# Patient Record
Sex: Female | Born: 1965 | Race: White | Hispanic: No | Marital: Married | Smoking: Never smoker
Health system: Southern US, Community
[De-identification: ages and names within clinical notes are randomized; demographics above are authoritative.]

## PROBLEM LIST (undated history)

## (undated) DIAGNOSIS — K219 Gastro-esophageal reflux disease without esophagitis: Secondary | ICD-10-CM

## (undated) DIAGNOSIS — I10 Essential (primary) hypertension: Secondary | ICD-10-CM

## (undated) DIAGNOSIS — Z800 Family history of malignant neoplasm of digestive organs: Secondary | ICD-10-CM

## (undated) DIAGNOSIS — C44629 Squamous cell carcinoma of skin of left upper limb, including shoulder: Secondary | ICD-10-CM

## (undated) DIAGNOSIS — N63 Unspecified lump in unspecified breast: Secondary | ICD-10-CM

## (undated) HISTORY — DX: Essential (primary) hypertension: I10

## (undated) HISTORY — PX: DILATION AND CURETTAGE OF UTERUS: SHX78

## (undated) HISTORY — DX: Family history of malignant neoplasm of digestive organs: Z80.0

## (undated) HISTORY — DX: Squamous cell carcinoma of skin of left upper limb, including shoulder: C44.629

## (undated) HISTORY — DX: Unspecified lump in unspecified breast: N63.0

---

## 1991-01-30 DIAGNOSIS — O321XX0 Maternal care for breech presentation, not applicable or unspecified: Secondary | ICD-10-CM

## 2005-07-30 ENCOUNTER — Ambulatory Visit: Payer: Self-pay | Admitting: Unknown Physician Specialty

## 2007-12-10 ENCOUNTER — Ambulatory Visit: Payer: Self-pay | Admitting: Unknown Physician Specialty

## 2008-12-14 ENCOUNTER — Ambulatory Visit: Payer: Self-pay | Admitting: Unknown Physician Specialty

## 2009-12-29 ENCOUNTER — Ambulatory Visit: Payer: Self-pay | Admitting: Unknown Physician Specialty

## 2010-01-18 ENCOUNTER — Ambulatory Visit: Payer: Self-pay | Admitting: Unknown Physician Specialty

## 2011-06-06 ENCOUNTER — Ambulatory Visit: Payer: Self-pay | Admitting: Unknown Physician Specialty

## 2011-10-23 DIAGNOSIS — N63 Unspecified lump in unspecified breast: Secondary | ICD-10-CM

## 2011-10-23 HISTORY — DX: Unspecified lump in unspecified breast: N63.0

## 2011-10-23 HISTORY — PX: BREAST BIOPSY: SHX20

## 2012-09-15 ENCOUNTER — Ambulatory Visit: Payer: Self-pay

## 2012-09-16 ENCOUNTER — Ambulatory Visit: Payer: Self-pay

## 2012-10-04 ENCOUNTER — Encounter: Payer: Self-pay | Admitting: General Surgery

## 2012-11-29 ENCOUNTER — Encounter: Payer: Self-pay | Admitting: *Deleted

## 2012-11-29 DIAGNOSIS — N63 Unspecified lump in unspecified breast: Secondary | ICD-10-CM | POA: Insufficient documentation

## 2013-01-13 ENCOUNTER — Ambulatory Visit: Payer: Self-pay | Admitting: General Surgery

## 2013-01-14 ENCOUNTER — Telehealth: Payer: Self-pay | Admitting: *Deleted

## 2013-01-14 NOTE — Telephone Encounter (Signed)
Left message for pt to call and reschedule missed appointment 01-13-13 with Dr Evette Cristal

## 2013-02-02 ENCOUNTER — Encounter: Payer: Self-pay | Admitting: General Surgery

## 2013-02-02 ENCOUNTER — Other Ambulatory Visit: Payer: Self-pay

## 2013-02-02 ENCOUNTER — Ambulatory Visit (INDEPENDENT_AMBULATORY_CARE_PROVIDER_SITE_OTHER): Payer: BC Managed Care – PPO | Admitting: General Surgery

## 2013-02-02 VITALS — BP 126/76 | Ht 63.0 in | Wt 166.0 lb

## 2013-02-02 DIAGNOSIS — D249 Benign neoplasm of unspecified breast: Secondary | ICD-10-CM | POA: Insufficient documentation

## 2013-02-02 DIAGNOSIS — N63 Unspecified lump in unspecified breast: Secondary | ICD-10-CM

## 2013-02-02 DIAGNOSIS — D241 Benign neoplasm of right breast: Secondary | ICD-10-CM

## 2013-02-02 NOTE — Patient Instructions (Addendum)
Patient to have right breast diagnostic mammogram in June 2014.

## 2013-02-02 NOTE — Progress Notes (Signed)
Patient ID: Jessica Harmon, female   DOB: 1966/05/20, 47 y.o.   MRN: [id number]  Chief Complaint  Patient presents with  . Follow-up    3 month breast ultasound    HPI Jessica Harmon is a 47 y.o. female who presents for a 3 month follow up breast ultrasound. The patient was last seen in December 2013 were she underwent a right breast biopsy that was benign as well as two cysts that were aspirated in the right breast. Path report showed a tubular adenoma. The patient states no problems with her breast at this time.  HPI  Past Medical History  Diagnosis Date  . Hypertension   . Lump or mass in breast 2013    Past Surgical History  Procedure Laterality Date  . Cesarean section  1992,2000    Family History  Problem Relation Age of Onset  . Breast cancer Paternal Aunt 7    Social History History  Substance Use Topics  . Smoking status: Never Smoker   . Smokeless tobacco: Not on file  . Alcohol Use: No    No Known Allergies  Current Outpatient Prescriptions  Medication Sig Dispense Refill  . ramipril (ALTACE) 5 MG tablet Take 5 mg by mouth daily.       No current facility-administered medications for this visit.    Review of Systems Review of Systems  Constitutional: Negative.   Cardiovascular: Negative.     Blood pressure 126/76, height 5\' 3"  (1.6 m), weight 166 lb (75.297 kg), last menstrual period 10/04/2012.  Physical Exam Physical Exam  Constitutional: She appears well-developed and well-nourished.  Eyes: Conjunctivae are normal. No scleral icterus.  Neck: Trachea normal. No mass and no thyromegaly present.  Pulmonary/Chest: Right breast exhibits no inverted nipple, no mass, no nipple discharge, no skin change and no tenderness.  Lymphadenopathy:    She has no cervical adenopathy.    She has no axillary adenopathy.    Data Reviewed Ultrasound done here shows stable nodule in right breast.  Assessment    Benign neoplasm right breast     Plan   F/u right mammogram 2 mos        SANKAR,SEEPLAPUTHUR G 02/03/2013, 11:39 AM

## 2013-02-03 ENCOUNTER — Encounter: Payer: Self-pay | Admitting: General Surgery

## 2013-03-31 ENCOUNTER — Ambulatory Visit: Payer: Self-pay | Admitting: General Surgery

## 2013-04-08 ENCOUNTER — Encounter: Payer: Self-pay | Admitting: General Surgery

## 2013-04-09 ENCOUNTER — Ambulatory Visit: Payer: Self-pay | Admitting: General Surgery

## 2013-04-13 ENCOUNTER — Encounter: Payer: Self-pay | Admitting: General Surgery

## 2013-04-13 ENCOUNTER — Ambulatory Visit (INDEPENDENT_AMBULATORY_CARE_PROVIDER_SITE_OTHER): Payer: BC Managed Care – PPO | Admitting: General Surgery

## 2013-04-13 VITALS — BP 120/82 | HR 80 | Resp 14 | Ht 63.0 in | Wt 167.0 lb

## 2013-04-13 DIAGNOSIS — D249 Benign neoplasm of unspecified breast: Secondary | ICD-10-CM

## 2013-04-13 DIAGNOSIS — D241 Benign neoplasm of right breast: Secondary | ICD-10-CM

## 2013-04-13 NOTE — Progress Notes (Signed)
Patient ID: Jessica Harmon, female   DOB: [birth year], 47 y.o.   MRN: [id number]  Chief Complaint  Patient presents with  . Follow-up    mammogram    HPI Jessica Harmon is a 47 y.o. female who presents for a breast evaluation. The most recent mammogram was done on 03/31/13 with birad category 2. Patient does perform self breast checks every other month and gets regular mammograms done. The patient has a paternal aunt who was diagnosed in her fifties with breast cancer. No new problems with her breasts at this time. She had core biopsy of small mass right breast December 2013 -was a benign tubular adenoma.   HPI  Past Medical History  Diagnosis Date  . Hypertension   . Lump or mass in breast 2013    Past Surgical History  Procedure Laterality Date  . Cesarean section  1992,2000    Family History  Problem Relation Age of Onset  . Breast cancer Paternal Aunt 36    Social History History  Substance Use Topics  . Smoking status: Never Smoker   . Smokeless tobacco: Not on file  . Alcohol Use: No    No Known Allergies  Current Outpatient Prescriptions  Medication Sig Dispense Refill  . ramipril (ALTACE) 5 MG tablet Take 5 mg by mouth daily.       No current facility-administered medications for this visit.    Review of Systems Review of Systems  Constitutional: Negative.   Respiratory: Negative.   Cardiovascular: Negative.     Blood pressure 120/82, pulse 80, resp. rate 14, height 5\' 3"  (1.6 m), weight 167 lb (75.751 kg), last menstrual period 10/13/2012.  Physical Exam Physical Exam  Constitutional: She is oriented to person, place, and time. She appears well-developed and well-nourished.  Eyes: Conjunctivae are normal. No scleral icterus.  Neck: Trachea normal. No mass and no thyromegaly present.  Pulmonary/Chest: Right breast exhibits no inverted nipple, no mass, no nipple discharge, no skin change and no tenderness. Left breast exhibits no inverted nipple, no  mass, no nipple discharge, no skin change and no tenderness. Breasts are symmetrical.  Lymphadenopathy:    She has no cervical adenopathy.    She has no axillary adenopathy.  Neurological: She is alert and oriented to person, place, and time.  Skin: Skin is warm.    Data Reviewed Prior notes and path report and her right mammogram  Assessment    Stable exam, benign neoplasm right breast     Plan    RTC in 5 mos with bil screening mammogram        Maleiya Pergola G 04/13/2013, 7:25 PM

## 2013-04-13 NOTE — Patient Instructions (Addendum)
To return in 5 months with bilateral screening mammogram.

## 2013-09-16 ENCOUNTER — Ambulatory Visit: Payer: Self-pay | Admitting: General Surgery

## 2013-09-21 ENCOUNTER — Encounter: Payer: Self-pay | Admitting: General Surgery

## 2013-09-29 ENCOUNTER — Ambulatory Visit: Payer: BC Managed Care – PPO | Admitting: General Surgery

## 2014-08-23 ENCOUNTER — Encounter: Payer: Self-pay | Admitting: General Surgery

## 2014-11-18 ENCOUNTER — Ambulatory Visit: Payer: Self-pay | Admitting: Certified Nurse Midwife

## 2015-10-23 DIAGNOSIS — C44629 Squamous cell carcinoma of skin of left upper limb, including shoulder: Secondary | ICD-10-CM

## 2015-10-23 HISTORY — DX: Squamous cell carcinoma of skin of left upper limb, including shoulder: C44.629

## 2015-10-23 HISTORY — PX: OTHER SURGICAL HISTORY: SHX169

## 2015-12-06 ENCOUNTER — Other Ambulatory Visit: Payer: Self-pay | Admitting: Certified Nurse Midwife

## 2015-12-06 DIAGNOSIS — Z1231 Encounter for screening mammogram for malignant neoplasm of breast: Secondary | ICD-10-CM

## 2015-12-15 ENCOUNTER — Ambulatory Visit
Admission: RE | Admit: 2015-12-15 | Discharge: 2015-12-15 | Disposition: A | Discharge status: Home / Self Care | Payer: BLUE CROSS/BLUE SHIELD | Source: Ambulatory Visit | Attending: Certified Nurse Midwife | Admitting: Certified Nurse Midwife

## 2015-12-15 DIAGNOSIS — Z1231 Encounter for screening mammogram for malignant neoplasm of breast: Secondary | ICD-10-CM

## 2016-12-03 DIAGNOSIS — J00 Acute nasopharyngitis [common cold]: Secondary | ICD-10-CM | POA: Diagnosis not present

## 2016-12-03 DIAGNOSIS — R05 Cough: Secondary | ICD-10-CM | POA: Diagnosis not present

## 2016-12-29 DIAGNOSIS — H00012 Hordeolum externum right lower eyelid: Secondary | ICD-10-CM | POA: Diagnosis not present

## 2017-06-12 ENCOUNTER — Ambulatory Visit (INDEPENDENT_AMBULATORY_CARE_PROVIDER_SITE_OTHER): Payer: 59 | Admitting: Certified Nurse Midwife

## 2017-06-12 ENCOUNTER — Encounter: Payer: Self-pay | Admitting: Certified Nurse Midwife

## 2017-06-12 ENCOUNTER — Other Ambulatory Visit: Payer: Self-pay | Admitting: Certified Nurse Midwife

## 2017-06-12 VITALS — BP 126/76 | HR 74 | Ht 63.0 in | Wt 178.0 lb

## 2017-06-12 DIAGNOSIS — Z124 Encounter for screening for malignant neoplasm of cervix: Secondary | ICD-10-CM | POA: Diagnosis not present

## 2017-06-12 DIAGNOSIS — R072 Precordial pain: Secondary | ICD-10-CM

## 2017-06-12 DIAGNOSIS — Z1239 Encounter for other screening for malignant neoplasm of breast: Secondary | ICD-10-CM

## 2017-06-12 DIAGNOSIS — Z01419 Encounter for gynecological examination (general) (routine) without abnormal findings: Secondary | ICD-10-CM | POA: Diagnosis not present

## 2017-06-12 DIAGNOSIS — Z1211 Encounter for screening for malignant neoplasm of colon: Secondary | ICD-10-CM

## 2017-06-12 DIAGNOSIS — Z1231 Encounter for screening mammogram for malignant neoplasm of breast: Secondary | ICD-10-CM | POA: Diagnosis not present

## 2017-06-12 NOTE — Progress Notes (Addendum)
Gynecology Annual Exam  PCP: Marinda Elk, MD  Chief Complaint:  Chief Complaint  Patient presents with  . Gynecologic Exam    History of Present Illness:Jessica Harmon is a 51 year old Caucasian/White female, G4 P2022, who presents for her annual exam. She is having no significant GYN problems. Does complain of recent problems (3-6 months) with "feeling like some foods are sticking" and won't go into stomach. Sometimes has to induce vomiting to dislodge food. Has a history of heartburn for years for which she takes Tums, once or twice a day.Has not had a EGD in the past. Has never had a colonoscopy. Her menses are absent since 09/2012 and she is postmenopausal. She does not have difficulty sleeping, hot flashes, and vaginal dryness.  She has had no spotting.   The patient's past medical history is notable for a history of hypertension. She had a right breast bx in 12/13 which was benign..  Since her last annual GYN exam dated 11/09/2015, she has had a skin biopsy of a lesion on her left forearm which turned out to be squamous cell carcinoma.  She is sexually active.  Her most recent pap smear was obtained 11/09/2015 and was negative  Her most recent mammogram obtained on 12/15/2015 which was negative.  There is a positive history of breast cancer in her paternal aunt. Genetic testing has not been done.  There is no family history of ovarian cancer.  The patient does do occ monthly self breast exams.  The patient does not smoke.  The patient does not drink alcohol.  The patient does not use illegal drugs.  The patient exercises by walking 2 miles/day The patient does not get adequate calcium in her diet.  She had a recent cholesterol screen in 2017 by PCP Boykin Reaper at [facility] that was normal.    The patient denies current symptoms of depression.    Review of Systems: Review of Systems  Constitutional: Negative for chills, fever and weight loss.  HENT: Negative for  congestion, sinus pain and sore throat.   Eyes: Negative for blurred vision and pain.  Respiratory: Negative for hemoptysis, shortness of breath and wheezing.   Cardiovascular: Negative for chest pain, palpitations and leg swelling.  Gastrointestinal: Negative for abdominal pain, blood in stool, diarrhea, heartburn, nausea and vomiting.  Genitourinary: Negative for dysuria, frequency, hematuria and urgency.  Musculoskeletal: Negative for back pain, joint pain and myalgias.  Skin: Negative for itching and rash.  Neurological: Negative for dizziness, tingling and headaches.  Endo/Heme/Allergies: Negative for environmental allergies and polydipsia. Does not bruise/bleed easily.       Negative for hirsutism   Psychiatric/Behavioral: Negative for depression. The patient is not nervous/anxious and does not have insomnia.     Past Medical History:  Past Medical History:  Diagnosis Date  . Hypertension   . Lump or mass in breast 2013  . Squamous cell cancer of skin of forearm, left 2017    Past Surgical History:  Past Surgical History:  Procedure Laterality Date  . BREAST BIOPSY Right 2013   needle  . CESAREAN SECTION  1992,2000  . DILATION AND CURETTAGE OF UTERUS    . excision of squamous cell cancer of left forearm  2017    Family History:  Family History  Problem Relation Age of Onset  . Breast cancer Paternal Aunt 58  . Hyperlipidemia Mother   . Hypertension Mother   . Neuropathy Mother  in feet  . Hypertension Father   . Hypertension Sister   . Hypertension Brother   . Hypertension Brother   . Pancreatic cancer Maternal Aunt 60    Social History:  Social History   Social History  . Marital status: Married    Spouse name: N/A  . Number of children: 2  . Years of education: N/A   Occupational History  . Business assistant    Social History Main Topics  . Smoking status: Never Smoker  . Smokeless tobacco: Never Used  . Alcohol use No  . Drug use: No  .  Sexual activity: Yes    Partners: Male    Birth control/ protection: Post-menopausal   Other Topics Concern  . Not on file   Social History Narrative   Moved to near [place] course in 2017/18.  [name] graduated from [employer] and now lives in [place]. Son Hold graduated from high school in 2018 and will be attending [employer].    Allergies:  No Known Allergies  Medications: Tums Ultra Physical Exam Vitals:BP 126/76   Pulse 74   Ht 5\' 3"  (1.6 m)   Wt 178 lb (80.7 kg)   LMP 10/13/2012   BMI 31.53 kg/m   General: Pleasant WF in  NAD HEENT: normocephalic, anicteric Neck: no thyroid enlargement, no palpable nodules, no cervical lymphadenopathy  Pulmonary: No increased work of breathing, CTAB Cardiovascular: RRR, without murmur  Breast: Breast symmetrical, no tenderness, no palpable nodules or masses, no skin or nipple retraction present, no nipple discharge.  No axillary, infraclavicular or supraclavicular lymphadenopathy. Abdomen: Soft, non-tender, non-distended.  Umbilicus without lesions.  No hepatomegaly or masses palpable. No evidence of hernia. Genitourinary:  External: Normal external female genitalia.  Normal urethral meatus, normal Bartholin's and Skene's glands.    Vagina: decreased rugae, no evidence of prolapse, only able to do single digit pelvic exam due to contracted vaginal opening    Cervix: Grossly normal in appearance, no bleeding, non-tender  Uterus: Anteverted, normal size, shape, and consistency, mobile, and non-tender  Adnexa: No adnexal masses, non-tender  Rectal: deferred  Lymphatic: no evidence of inguinal lymphadenopathy Extremities: no edema, erythema, or tenderness Neurologic: Grossly intact Psychiatric: mood appropriate, affect full     Assessment: 51 y.o. [id number] annual gyn exam GI problems/ Age related risk of colon cancer Plan:   1) Breast cancer screening - recommend monthly self breast exam and annual screening mammograms. Mammogram  was ordered today.  Patient to schedule at [facility]  2) Referral to GI for evaluation of substernal discomfort when eating and for colonoscopy  3) Cervical cancer screening - Pap was done.   4) Osteoporosis prevention: discussed calcium and vitamin D3 requirements.   5) Routine healthcare maintenance including cholesterol and diabetes screening managed by PCP   Dalia Heading, CNM

## 2017-06-12 NOTE — Patient Instructions (Signed)
Preventing Osteoporosis, Adult Osteoporosis is a condition that causes the bones to get weaker. With osteoporosis, the bones become thinner, and the normal spaces in bone tissue become larger. This can make the bones weak and cause them to break more easily. People who have osteoporosis are more likely to break their wrist, spine, or hip. Even a minor accident or injury can be enough to break weak bones. Osteoporosis can occur with aging. Your body constantly replaces old bone tissue with new tissue. As you get older, you may lose bone tissue more quickly, or it may be replaced more slowly. Osteoporosis is more likely to develop if you have poor nutrition or do not get enough calcium or vitamin D. Other lifestyle factors can also play a role. By making some diet and lifestyle changes, you can help to keep your bones healthy and help to prevent osteoporosis. What nutrition changes can be made? Nutrition plays an important role in maintaining healthy, strong bones.  Make sure you get enough calcium every day from food or from calcium supplements. ? If you are age 50 or younger, aim to get 1,000 mg of calcium every day. ? If you are older than age 50, aim to get 1,200 mg of calcium every day.  Try to get enough vitamin D every day. ? If you are age 70 or younger, aim to get 600 international units (IU) every day. ? If you are older than age 70, aim to get 800 international units every day.  Follow a healthy diet. Eat plenty of foods that contain calcium and vitamin D. ? Calcium is in milk, cheese, yogurt, and other dairy products. Some fish and vegetables are also good sources of calcium. Many foods such as cereals and breads have had calcium added to them (are fortified). Check nutrition labels to see how much calcium is in a food or drink. ? Foods that contain vitamin D include milk, cereals, salmon, and tuna. Your body also makes vitamin D when you are out in the sun. Bare skin exposure to the sun on  your face, arms, legs, or back for no more than 30 minutes a day, 2 times per week is more than enough. Beyond that, it is important to use sunblock to protect your skin from sunburn, which increases your risk for skin cancer.  What lifestyle changes can be made? Making changes in your everyday life can also play an important role in preventing osteoporosis.  Stay active and get exercise every day. Ask your health care provider what types of exercise are best for you.  Do not use any products that contain nicotine or tobacco, such as cigarettes and e-cigarettes. If you need help quitting, ask your health care provider.  Limit alcohol intake to no more than 1 drink a day for nonpregnant women and 2 drinks a day for men. One drink equals 12 oz of beer, 5 oz of wine, or 1 oz of hard liquor.  Why are these changes important? Making these nutrition and lifestyle changes can:  Help you develop and maintain healthy, strong bones.  Prevent loss of bone mass and the problems that are caused by that loss, such as broken bones and delayed healing.  Make you feel better mentally and physically.  What can happen if changes are not made? Problems that can result from osteoporosis can be very serious. These may include:  A higher risk of broken bones that are painful and do not heal well.  Physical malformations, such as   a collapsed spine or a hunched back.  Problems with movement.  Where to find support: If you need help making changes to prevent osteoporosis, talk with your health care provider. You can ask for a referral to a diet and nutrition specialist (dietitian) and a physical therapist. Where to find more information: Learn more about osteoporosis from:  NIH Osteoporosis and Related Bone Diseases National Resource Center: www.niams.nih.gov/health_info/bone/osteoporosis/osteoporosis_ff.asp  U.S. Office on Women's Health:  www.womenshealth.gov/publications/our-publications/fact-sheet/osteoporosis.html  National Osteoporosis Foundation: www.nof.org/patients/what-is-osteoporosis/  Summary  Osteoporosis is a condition that causes weak bones that are more likely to break.  Eating a healthy diet and making sure you get enough calcium and vitamin D can help prevent osteoporosis.  Other ways to reduce your risk of osteoporosis include getting regular exercise and avoiding alcohol and products that contain nicotine or tobacco. This information is not intended to replace advice given to you by your health care provider. Make sure you discuss any questions you have with your health care provider. Document Released: 10/23/2015 Document Revised: 06/18/2016 Document Reviewed: 06/18/2016 Elsevier Interactive Patient Education  2018 Elsevier Inc.  

## 2017-06-15 LAB — IGP,RFX APTIMA HPV ALL PTH: PAP SMEAR COMMENT: 0

## 2017-07-03 ENCOUNTER — Ambulatory Visit
Admission: RE | Admit: 2017-07-03 | Discharge: 2017-07-03 | Disposition: A | Discharge status: Home / Self Care | Payer: 59 | Source: Ambulatory Visit | Attending: Certified Nurse Midwife | Admitting: Certified Nurse Midwife

## 2017-07-03 DIAGNOSIS — Z1231 Encounter for screening mammogram for malignant neoplasm of breast: Secondary | ICD-10-CM | POA: Insufficient documentation

## 2017-07-03 DIAGNOSIS — Z1239 Encounter for other screening for malignant neoplasm of breast: Secondary | ICD-10-CM

## 2017-07-04 ENCOUNTER — Other Ambulatory Visit: Payer: Self-pay | Admitting: Certified Nurse Midwife

## 2017-07-04 DIAGNOSIS — N6489 Other specified disorders of breast: Secondary | ICD-10-CM

## 2017-07-04 DIAGNOSIS — N6002 Solitary cyst of left breast: Secondary | ICD-10-CM | POA: Diagnosis not present

## 2017-07-04 DIAGNOSIS — N6320 Unspecified lump in the left breast, unspecified quadrant: Secondary | ICD-10-CM

## 2017-07-04 DIAGNOSIS — R928 Other abnormal and inconclusive findings on diagnostic imaging of breast: Secondary | ICD-10-CM

## 2017-07-18 ENCOUNTER — Ambulatory Visit
Admission: RE | Admit: 2017-07-18 | Discharge: 2017-07-18 | Disposition: A | Discharge status: Home / Self Care | Payer: 59 | Source: Ambulatory Visit | Attending: Certified Nurse Midwife | Admitting: Certified Nurse Midwife

## 2017-07-18 DIAGNOSIS — N6489 Other specified disorders of breast: Secondary | ICD-10-CM

## 2017-07-18 DIAGNOSIS — N6320 Unspecified lump in the left breast, unspecified quadrant: Secondary | ICD-10-CM

## 2017-07-18 DIAGNOSIS — N6001 Solitary cyst of right breast: Secondary | ICD-10-CM | POA: Diagnosis not present

## 2017-07-18 DIAGNOSIS — R928 Other abnormal and inconclusive findings on diagnostic imaging of breast: Secondary | ICD-10-CM

## 2017-07-18 DIAGNOSIS — N6002 Solitary cyst of left breast: Secondary | ICD-10-CM | POA: Diagnosis not present

## 2017-08-13 DIAGNOSIS — N390 Urinary tract infection, site not specified: Secondary | ICD-10-CM | POA: Diagnosis not present

## 2017-11-24 DIAGNOSIS — I10 Essential (primary) hypertension: Secondary | ICD-10-CM | POA: Insufficient documentation

## 2017-11-24 DIAGNOSIS — Z85828 Personal history of other malignant neoplasm of skin: Secondary | ICD-10-CM | POA: Diagnosis not present

## 2017-11-24 DIAGNOSIS — J019 Acute sinusitis, unspecified: Secondary | ICD-10-CM | POA: Insufficient documentation

## 2017-11-24 DIAGNOSIS — R42 Dizziness and giddiness: Secondary | ICD-10-CM | POA: Diagnosis present

## 2017-11-24 DIAGNOSIS — R05 Cough: Secondary | ICD-10-CM | POA: Diagnosis not present

## 2017-11-24 DIAGNOSIS — R55 Syncope and collapse: Secondary | ICD-10-CM | POA: Diagnosis not present

## 2017-11-25 ENCOUNTER — Encounter: Payer: Self-pay | Admitting: Emergency Medicine

## 2017-11-25 ENCOUNTER — Emergency Department: Payer: Commercial Managed Care - HMO

## 2017-11-25 ENCOUNTER — Other Ambulatory Visit: Payer: Self-pay

## 2017-11-25 ENCOUNTER — Emergency Department
Admission: EM | Admit: 2017-11-25 | Discharge: 2017-11-25 | Disposition: A | Discharge status: Home / Self Care | Payer: Commercial Managed Care - HMO | Attending: Emergency Medicine | Admitting: Emergency Medicine

## 2017-11-25 DIAGNOSIS — J019 Acute sinusitis, unspecified: Secondary | ICD-10-CM

## 2017-11-25 DIAGNOSIS — R55 Syncope and collapse: Secondary | ICD-10-CM

## 2017-11-25 DIAGNOSIS — R05 Cough: Secondary | ICD-10-CM | POA: Diagnosis not present

## 2017-11-25 LAB — BASIC METABOLIC PANEL
Anion gap: 13 (ref 5–15)
BUN: 17 mg/dL (ref 6–20)
CHLORIDE: 110 mmol/L (ref 101–111)
CO2: 26 mmol/L (ref 22–32)
Calcium: 9.8 mg/dL (ref 8.9–10.3)
Creatinine, Ser: 0.85 mg/dL (ref 0.44–1.00)
GFR calc Af Amer: >60 mL/min (ref >60)
GFR calc non Af Amer: >60 mL/min (ref >60)
GLUCOSE: 105 mg/dL — AB (ref 65–99)
POTASSIUM: 3.6 mmol/L (ref 3.5–5.1)
Sodium: 149 mmol/L — ABNORMAL HIGH (ref 135–145)

## 2017-11-25 LAB — CBC
HEMATOCRIT: 36.2 % (ref 35.0–47.0)
Hemoglobin: 11.3 g/dL — ABNORMAL LOW (ref 12.0–16.0)
MCH: 25.2 pg — AB (ref 26.0–34.0)
MCHC: 31.3 g/dL — AB (ref 32.0–36.0)
MCV: 80.5 fL (ref 80.0–100.0)
Platelets: 331 10*3/uL (ref 150–440)
RBC: 4.5 MIL/uL (ref 3.80–5.20)
RDW: 13.4 % (ref 11.5–14.5)
WBC: 9.6 10*3/uL (ref 3.6–11.0)

## 2017-11-25 LAB — TROPONIN I
Troponin I: <0.03 ng/mL (ref <0.03)
Troponin I: <0.03 ng/mL (ref <0.03)

## 2017-11-25 MED ORDER — AMOXICILLIN-POT CLAVULANATE 875-125 MG PO TABS
1.0000 | ORAL_TABLET | Freq: Once | ORAL | Status: AC
  Administered 2017-11-25: 1 via ORAL
  Filled 2017-11-25: qty 1

## 2017-11-25 MED ORDER — AMOXICILLIN-POT CLAVULANATE 875-125 MG PO TABS: 1.0000 | ORAL_TABLET | Freq: Two times a day (BID) | ORAL | 0 refills | Status: AC

## 2017-11-25 NOTE — ED Provider Notes (Signed)
[facility] Emergency Department Provider Note   ____________________________________________   First MD Initiated Contact with Patient 11/25/17 0515     (approximate)  I have reviewed the triage vital signs and the nursing notes.   HISTORY  Chief Complaint Cough; Nasal Congestion; and Arm Pain    HPI Jessica Harmon is a 52 y.o. female who comes into the hospital today with some lightheadedness and presyncopal symptoms.  The patient reports that she has had an upper respiratory infection for the past week and she did not feel well.  She reports that she was laying in bed and thought she was going to pass out.  She reports that it was just weird feeling.  She had some chills and she was shaky.  The patient has had some cough and runny nose but denies any shortness of breath or fevers.  She has been using Mucinex and Alka-Seltzer at home.  She states that it is difficult to describe the symptoms she had.  She states that she felt a little lightheaded.  She has not been eating much this week but reports that she has been drinking ginger ale.  The patient had some nasal stuffiness as well as some sinus pressure with right ear pain but otherwise no other complaints.  She has had no shortness of breath no chest pain no nausea no vomiting.  Patient is here today for evaluation.   Past Medical History:  Diagnosis Date  . Hypertension   . Lump or mass in breast 2013  . Squamous cell cancer of skin of forearm, left 2017    Patient Active Problem List   Diagnosis Date Noted  . Benign neoplasm of breast 02/02/2013  . Lump or mass in breast     Past Surgical History:  Procedure Laterality Date  . BREAST BIOPSY Right 2013   needle  . CESAREAN SECTION  1992,2000  . DILATION AND CURETTAGE OF UTERUS    . excision of squamous cell cancer of left forearm  2017    Prior to Admission medications   Medication Sig Start Date End Date Taking? Authorizing Provider    amoxicillin-clavulanate (AUGMENTIN) 875-125 MG tablet Take 1 tablet by mouth 2 (two) times daily for 10 days. 11/25/17 12/05/17  Loney Hering, MD    Allergies Patient has no known allergies.  Family History  Problem Relation Age of Onset  . Breast cancer Paternal Aunt 68  . Hyperlipidemia Mother   . Hypertension Mother   . Neuropathy Mother        in feet  . Hypertension Father   . Hypertension Sister   . Hypertension Brother   . Hypertension Brother   . Pancreatic cancer Maternal Aunt 60    Social History Social History   Tobacco Use  . Smoking status: Never Smoker  . Smokeless tobacco: Never Used  Substance Use Topics  . Alcohol use: No  . Drug use: No    Review of Systems  Constitutional: No fever/chills Eyes: No visual changes. ENT: Nasal congestion, tenderness to palpation of maxillary, ethmoid and sphenoid sinuses Cardiovascular: Denies chest pain. Respiratory: cough Gastrointestinal: No abdominal pain.  No nausea, no vomiting.  No diarrhea.  No constipation. Genitourinary: Negative for dysuria. Musculoskeletal: Negative for back pain. Skin: Negative for rash. Neurological: Presyncope, lightheadedness   ____________________________________________   PHYSICAL EXAM:  VITAL SIGNS: ED Triage Vitals  Enc Vitals Group     BP 11/25/17 0018 (!) 167/93     Pulse Rate  11/25/17 0018 77     Resp 11/25/17 0018 16     Temp 11/25/17 0018 97.9 F (36.6 C)     Temp Source 11/25/17 0018 Oral     SpO2 11/25/17 0018 99 %     Weight 11/25/17 0018 165 lb (74.8 kg)     Height 11/25/17 0018 5\' 3"  (1.6 m)     Head Circumference --      Peak Flow --      Pain Score 11/25/17 0503 5     Pain Loc --      Pain Edu? --      Excl. in [place]? --     Constitutional: Alert and oriented. Well appearing and in mild distress. Eyes: Conjunctivae are normal. PERRL. EOMI. Head: Atraumatic. Nose: No congestion/rhinnorhea. Mouth/Throat: Mucous membranes are moist.  Oropharynx  non-erythematous. Cardiovascular: Normal rate, regular rhythm. Grossly normal heart sounds.  Good peripheral circulation. Respiratory: Normal respiratory effort.  No retractions. Lungs CTAB. Gastrointestinal: Soft and nontender. No distention. Positive bowel sounds Musculoskeletal: No lower extremity tenderness nor edema.   Neurologic:  Normal speech and language.  Cranial nerves II through XII are grossly intact with no focal motor neuro deficit Skin:  Skin is warm, dry and intact.  Psychiatric: Mood and affect are normal.   ____________________________________________   LABS (all labs ordered are listed, but only abnormal results are displayed)  Labs Reviewed  BASIC METABOLIC PANEL - Abnormal; Notable for the following components:      Result Value   Sodium 149 (*)    Glucose, Bld 105 (*)    All other components within normal limits  CBC - Abnormal; Notable for the following components:   Hemoglobin 11.3 (*)    MCH 25.2 (*)    MCHC 31.3 (*)    All other components within normal limits  TROPONIN I  TROPONIN I   ____________________________________________  EKG  ED ECG REPORT I, Loney Hering, the attending physician, personally viewed and interpreted this ECG.   Date: 11/25/2017  EKG Time: 0034  Rate: 78  Rhythm: normal sinus rhythm  Axis: normal  Intervals:none  ST&T Change: none  ____________________________________________  RADIOLOGY  ED MD interpretation:    CXR: No acute disease  Official radiology report(s): Dg Chest 2 View  Result Date: 11/25/2017 CLINICAL DATA:  Cough with near syncope EXAM: CHEST  2 VIEW COMPARISON:  None. FINDINGS: The heart size and mediastinal contours are within normal limits. Both lungs are clear. The visualized skeletal structures are unremarkable. Mild bronchitic changes. IMPRESSION: No active cardiopulmonary disease.  Mild bronchitic changes. Electronically Signed   By: Donavan Foil M.D.   On: 11/25/2017 02:01     ____________________________________________   PROCEDURES  Procedure(s) performed: None  Procedures  Critical Care performed: No  ____________________________________________   INITIAL IMPRESSION / ASSESSMENT AND PLAN / ED COURSE  As part of my medical decision making, I reviewed the following data within the electronic MEDICAL RECORD NUMBER Notes from prior ED visits and [place] Controlled Substance Database   This is a 52 year old female who comes into the hospital today with some lightheadedness and presyncope at home.  The patient is also been having some signs of sinusitis.  My differential diagnosis includes sinusitis, MI, syncope   We did check some blood work on the patient and it was unremarkable.  Her CBC is negative and she has some mild dehydration with a sodium of 149.  The patient also had a chest x-ray in 2 troponins  which were also negative. The patient likely has sinusitis which I will treat with augmentin. The patient will be discharged to home to follow up with her primary care physician.      ____________________________________________   FINAL CLINICAL IMPRESSION(S) / ED DIAGNOSES  Final diagnoses:  Acute non-recurrent sinusitis, unspecified location  Pre-syncope     ED Discharge Orders        Ordered    amoxicillin-clavulanate (AUGMENTIN) 875-125 MG tablet  2 times daily     11/25/17 0647       Note:  This document was prepared using Dragon voice recognition software and may include unintentional dictation errors.    Loney Hering, MD 11/25/17 [id number]

## 2017-11-25 NOTE — ED Notes (Signed)
Pt sitting in lobby with spouse in no acute distress.

## 2017-11-25 NOTE — ED Triage Notes (Signed)
Pt reports 5 days of sinus congestion and cough; pt says when she went to bed she felt "weird"; says she got weird feeling in her left arm and then "may have panicked"; reports feeling like she was going to pass out but did not; pt hoarse but talking in complete coherent sentences; pt says her throat feels "raw" from coughing

## 2017-11-25 NOTE — ED Notes (Signed)
Pt updated on delay. Pt verbalizes understanding.  

## 2017-11-25 NOTE — ED Notes (Signed)
Pt ambulatory with steady gait to treatment room; Ena Dawley, RN in to see pt

## 2017-11-25 NOTE — ED Triage Notes (Signed)
First rn note: pt states she has had uri symptoms "for awhile". Pt is hoarse. Pt states today has felt intermittently "shaky". Pt ambulatory without difficulty.

## 2017-11-25 NOTE — ED Notes (Signed)
Pt reports "stomach bug" and "cold" s/sx for the last 2 weeks, congestion with cough greenish production, HA, Nauseous, chills, pt reports feeling of wanting to pass out earlier but can't elaborate (denies ever passing out before), right ear pressure - appears unremarkable

## 2017-11-25 NOTE — Discharge Instructions (Signed)
Please follow up with your primary care physician for further evaluation of your symptoms.  °

## 2017-11-26 DIAGNOSIS — R42 Dizziness and giddiness: Secondary | ICD-10-CM | POA: Diagnosis not present

## 2017-11-26 DIAGNOSIS — J329 Chronic sinusitis, unspecified: Secondary | ICD-10-CM | POA: Diagnosis not present

## 2017-11-26 DIAGNOSIS — I10 Essential (primary) hypertension: Secondary | ICD-10-CM | POA: Diagnosis not present

## 2017-11-26 DIAGNOSIS — E870 Hyperosmolality and hypernatremia: Secondary | ICD-10-CM | POA: Diagnosis not present

## 2017-11-28 ENCOUNTER — Other Ambulatory Visit: Payer: Self-pay | Admitting: Physician Assistant

## 2017-11-28 DIAGNOSIS — R1310 Dysphagia, unspecified: Secondary | ICD-10-CM

## 2017-11-28 DIAGNOSIS — R1084 Generalized abdominal pain: Secondary | ICD-10-CM

## 2017-11-28 DIAGNOSIS — R110 Nausea: Secondary | ICD-10-CM

## 2017-11-28 DIAGNOSIS — I10 Essential (primary) hypertension: Secondary | ICD-10-CM | POA: Diagnosis not present

## 2017-11-29 ENCOUNTER — Ambulatory Visit
Admission: RE | Admit: 2017-11-29 | Discharge: 2017-11-29 | Disposition: A | Discharge status: Home / Self Care | Payer: 59 | Source: Ambulatory Visit | Attending: Physician Assistant | Admitting: Physician Assistant

## 2017-11-29 DIAGNOSIS — R1084 Generalized abdominal pain: Secondary | ICD-10-CM

## 2017-11-29 DIAGNOSIS — K8020 Calculus of gallbladder without cholecystitis without obstruction: Secondary | ICD-10-CM | POA: Diagnosis not present

## 2017-11-29 DIAGNOSIS — R1310 Dysphagia, unspecified: Secondary | ICD-10-CM

## 2017-11-29 DIAGNOSIS — R110 Nausea: Secondary | ICD-10-CM

## 2017-11-29 DIAGNOSIS — N281 Cyst of kidney, acquired: Secondary | ICD-10-CM | POA: Insufficient documentation

## 2017-11-29 DIAGNOSIS — K7689 Other specified diseases of liver: Secondary | ICD-10-CM | POA: Diagnosis not present

## 2017-12-03 ENCOUNTER — Ambulatory Visit: Payer: Self-pay | Admitting: General Surgery

## 2017-12-03 DIAGNOSIS — K8020 Calculus of gallbladder without cholecystitis without obstruction: Secondary | ICD-10-CM | POA: Diagnosis not present

## 2017-12-03 NOTE — H&P (Signed)
PATIENT PROFILE: Jessica Harmon is a 52 y.o. female who presents to the Clinic for consultation at the request of McLaughlin, PA for evaluation of cholelithiasis.  PCP:  McLaughlin, Miriam Klem, PA  HISTORY OF PRESENT ILLNESS: Ms. Jessica Harmon reports having abdominal pain since months ago. The patient refers that in the last weeks it has intensified and is causing right upper quadrant pain with associated nausea and vomiting. Pain does not radiates to other part of the body. Patient associates pain with food intake specially "grease food". Patient refers pain starts with the nausea and then comes the pain. Patient refers nothing in particular improves the pain. Denies fever. Refers chills with pain episodes.    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 Positive for chills. Allergy and Immunology ROS: negative for - hives  Hematological and Lymphatic ROS: negative for - bleeding problems or bruising, negative for palpable nodes Endocrine ROS: negative for - heat or cold intolerance, hair changes Respiratory ROS: negative for - cough, shortness of breath or wheezing. Positive for sinus congestion, pain and runny nose.  Cardiovascular ROS: no chest pain or palpitations GI ROS: Positive for nausea, vomiting, abdominal pain, diarrhea  Musculoskeletal ROS: negative for - joint swelling or muscle pain Neurological ROS: negative for - confusion, syncope. Positive for headaches Dermatological ROS: negative for pruritus and rash Psychiatric: negative for anxiety, depression, difficulty sleeping and memory loss  MEDICATIONS: CurrentMedications        Current Outpatient Medications  Medication Sig Dispense Refill  . amoxicillin-clavulanate  (AUGMENTIN) 875-125 mg tablet Take by mouth    . lisinopril (PRINIVIL,ZESTRIL) 10 MG tablet Take 1 tablet (10 mg total) by mouth once daily 30 tablet 5  . ondansetron (ZOFRAN) 4 MG tablet Take 1 tablet (4 mg total) by mouth every 8 (eight) hours as needed for Nausea 20 tablet 0   No current facility-administered medications for this visit.       ALLERGIES: Patient has no known allergies.  PAST MEDICAL HISTORY: Past Medical History:  Diagnosis Date  . Hypertension   . Iron deficiency anemia   . Menometrorrhagia     PAST SURGICAL HISTORY:      Past Surgical History:  Procedure Laterality Date  . CESAREAN SECTION     x2  . skin cancer removal Left 07/04/2016   Left forearm, Squamous cell carcinoma     FAMILY HISTORY:      Family History  Problem Relation Age of Onset  . High blood pressure (Hypertension) Mother   . Stroke Mother   . High blood pressure (Hypertension) Father   . High blood pressure (Hypertension) Sister   . High blood pressure (Hypertension) Brother   . High blood pressure (Hypertension) Brother   . Breast cancer Paternal Aunt      SOCIAL HISTORY: Social History        Socioeconomic History  . Marital status: Married    Spouse name: Not on file  . Number of children: Not on file  . Years of education: Not on file  . Highest education level: Not on file  Occupational History  . Not on file  Social Needs  . Financial resource   strain: Not on file  . Food insecurity:    Worry: Not on file    Inability: Not on file  . Transportation needs:    Medical: Not on file    Non-medical: Not on file  Tobacco Use  . Smoking status: Never Smoker  . Smokeless tobacco: Never Used  Substance and Sexual Activity  . Alcohol use: No    Alcohol/week: 0.0 oz  . Drug use: No  . Sexual activity: Yes  Lifestyle  . Physical activity:    Days per week: Not on file    Minutes per session: Not on file  . Stress: Not on  file  Relationships  . Social connections:    Talks on phone: Not on file    Gets together: Not on file    Attends religious service: Not on file    Active member of club or organization: Not on file    Attends meetings of clubs or organizations: Not on file    Relationship status: Not on file  . Intimate partner violence:    Fear of current or ex partner: Not on file    Emotionally abused: Not on file    Physically abused: Not on file    Forced sexual activity: Not on file  Other Topics Concern  . Not on file  Social History Narrative   Married with 2 sons aged 24 and 16yo.  No alcohol and no tobacco.  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    PHYSICAL EXAM:    Vitals:   12/03/17 0814  BP: 163/82  Pulse: 76  Temp: 36.2 C (97.2 F)   Body mass index is 32.77 kg/m. Weight: 83.9 kg (185 lb)   GENERAL: Alert, active, oriented x3  HEENT: Pupils equal reactive to light. Extraocular movements are intact. Sclera clear. Palpebral conjunctiva normal red color.Pharynx clear.  NECK: Supple with no palpable mass and no adenopathy.  LUNGS: Sound clear with no rales rhonchi or wheezes.  HEART: Regular rhythm S1 and S2 without murmur.  ABDOMEN: Soft and depressible, nontender with no palpable mass, no hepatomegaly.   EXTREMITIES: Well-developed well-nourished symmetrical with no dependent edema.  NEUROLOGICAL: Awake alert oriented, facial expression symmetrical, moving all extremities.  REVIEW OF DATA: I have reviewed the following data today:      Office Visit on 11/26/2017  Component Date Value  . Thyroid Stimulating Horm* 11/26/2017 1.498   . WBC (White Blood Cell Co* 11/26/2017 9.9   . RBC (Red Blood Cell Coun* 11/26/2017 4.30   . Hemoglobin 11/26/2017 11.1*  . Hematocrit 11/26/2017 35.8   . MCV (Mean Corpuscular Vo* 11/26/2017 83.3   . MCH (Mean Corpuscular He* 11/26/2017 25.8*  . MCHC (Mean Corpuscular H* 11/26/2017 31.0*   . Platelet Count 11/26/2017 347   . RDW-CV (Red Cell Distrib* 11/26/2017 13.1   . MPV (Mean Platelet Volum* 11/26/2017 10.5   . Neutrophils 11/26/2017 7.62   . Lymphocytes 11/26/2017 1.49   . Monocytes 11/26/2017 0.42   . Eosinophils 11/26/2017 0.25   . Basophils 11/26/2017 0.06   . Neutrophil % 11/26/2017 77.2*  . Lymphocyte % 11/26/2017 15.1   . Monocyte % 11/26/2017 4.3   . Eosinophil % 11/26/2017 2.5   . Basophil% 11/26/2017 0.6   . Immature Granulocyte % 11/26/2017 0.3   . Immature Granulocyte Cou* 11/26/2017 0.03   . Glucose 11/26/2017 98   . Sodium 11/26/2017 141   . Potassium 11/26/2017 3.7   . Chloride 11/26/2017 106   .   Carbon Dioxide (CO2) 11/26/2017 26.2   . Urea Nitrogen (BUN) 11/26/2017 14   . Creatinine 11/26/2017 0.8   . Glomerular Filtration Ra* 11/26/2017 75   . Calcium 11/26/2017 9.9   . AST  11/26/2017 19   . ALT  11/26/2017 16   . Alk Phos (alkaline Phosp* 11/26/2017 67   . Albumin 11/26/2017 4.2   . Bilirubin, Total 11/26/2017 0.4   . Protein, Total 11/26/2017 6.9   . A/G Ratio 11/26/2017 1.6   . Color 11/26/2017 Yellow   . Clarity 11/26/2017 Clear   . Specific Gravity 11/26/2017 1.025   . pH, Urine 11/26/2017 6.0   . Protein, Urinalysis 11/26/2017 Negative   . Glucose, Urinalysis 11/26/2017 Negative   . Ketones, Urinalysis 11/26/2017 Trace*  . Blood, Urinalysis 11/26/2017 Trace*  . Nitrite, Urinalysis 11/26/2017 Negative   . Leukocyte Esterase, Urin* 11/26/2017 Negative   . White Blood Cells, Urina* 11/26/2017 0-3   . Red Blood Cells, Urinaly* 11/26/2017 None Seen   . Bacteria, Urinalysis 11/26/2017 Few*  . Squamous Epithelial Cell* 11/26/2017 Rare     Abdominal sonogram images reviewed and found with multiple large liver cyst and a very small gallbladder. No pericholecystic fluid and no gallbladder wall thickening.   ASSESSMENT: Ms. Harbin is a 52 y.o. female presenting for consultation for cholelithiasis.  Patient with symptoms that are  associated with gallstone (right upper quadrant pain associated with food intake) and other non specific symptoms such as nausea and vomiting. Patient was oriented about cholelithiasis, its implications and its treatment alternative (observation vs cholecystectomy). Patient was oriented about the surgical techniques (open vs laparoscopic) and its risk of complications: bile duct injury, bile leak, bleeding, infection, retained stone, injury to adjacent organs, bowel obstruction among others. Patient was also oriented that the non specific symptoms may be caused by other conditions such as gastritis, GERD that can continue despite having the cholecystectomy. Patient understood and want to proceed with surgery. Patient has multiple family members with gallbladder disease that resolved with cholecystectomy (mother, father and sister)  PLAN: 1. Laparoscopic cholecystectomy 2. CBC, CMP - done 3. Internal medicine clearance.  4. Avoid fatty food 5. Do not take aspirin 5 days before surgery.   Patient verbalized understanding, all questions were answered, and were agreeable with the plan outlined above.   Daruis Swaim Cintron-Diaz, MD  Electronically signed by Tura Roller Cintron-Diaz, MD 

## 2017-12-03 NOTE — H&P (View-Only) (Signed)
PATIENT PROFILE: Jessica Harmon is a 52 y.o. female who presents to the Clinic for consultation at the request of Jessica Harmon, [place] for evaluation of cholelithiasis.  PCP:  Jessica Nightingale, PA  HISTORY OF PRESENT ILLNESS: Jessica Harmon reports having abdominal pain since months ago. The patient refers that in the last weeks it has intensified and is causing right upper quadrant pain with associated nausea and vomiting. Pain does not radiates to other part of the body. Patient associates pain with food intake specially "grease food". Patient refers pain starts with the nausea and then comes the pain. Patient refers nothing in particular improves the pain. Denies fever. Refers chills with pain episodes.    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 Positive for chills. Allergy and Immunology ROS: negative for - hives  Hematological and Lymphatic ROS: negative for - bleeding problems or bruising, negative for palpable nodes Endocrine ROS: negative for - heat or cold intolerance, hair changes Respiratory ROS: negative for - cough, shortness of breath or wheezing. Positive for sinus congestion, pain and runny nose.  Cardiovascular ROS: no chest pain or palpitations GI ROS: Positive for nausea, vomiting, abdominal pain, diarrhea  Musculoskeletal ROS: negative for - joint swelling or muscle pain Neurological ROS: negative for - confusion, syncope. Positive for headaches Dermatological ROS: negative for pruritus and rash Psychiatric: negative for anxiety, depression, difficulty sleeping and memory loss  MEDICATIONS: CurrentMedications        Current Outpatient Medications  Medication Sig Dispense Refill  . amoxicillin-clavulanate  (AUGMENTIN) 875-125 mg tablet Take by mouth    . lisinopril (PRINIVIL,ZESTRIL) 10 MG tablet Take 1 tablet (10 mg total) by mouth once daily 30 tablet 5  . ondansetron (ZOFRAN) 4 MG tablet Take 1 tablet (4 mg total) by mouth every 8 (eight) hours as needed for Nausea 20 tablet 0   No current facility-administered medications for this visit.       ALLERGIES: Patient has no known allergies.  PAST MEDICAL HISTORY: Past Medical History:  Diagnosis Date  . Hypertension   . Iron deficiency anemia   . Menometrorrhagia     PAST SURGICAL HISTORY:      Past Surgical History:  Procedure Laterality Date  . CESAREAN SECTION     x2  . skin cancer removal Left 07/04/2016   Left forearm, Squamous cell carcinoma     FAMILY HISTORY:      Family History  Problem Relation Age of Onset  . High blood pressure (Hypertension) Mother   . Stroke Mother   . High blood pressure (Hypertension) Father   . High blood pressure (Hypertension) Sister   . High blood pressure (Hypertension) Brother   . High blood pressure (Hypertension) Brother   . Breast cancer Paternal Aunt      SOCIAL HISTORY: Social History        Socioeconomic History  . Marital status: Married    Spouse name: Not on file  . Number of children: Not on file  . Years of education: Not on file  . Highest education level: Not on file  Occupational History  . Not on file  Social Needs  . Emergency planning/management officer  strain: Not on file  . Food insecurity:    Worry: Not on file    Inability: Not on file  . Transportation needs:    Medical: Not on file    Non-medical: Not on file  Tobacco Use  . Smoking status: Never Smoker  . Smokeless tobacco: Never Used  Substance and Sexual Activity  . Alcohol use: No    Alcohol/week: 0.0 oz  . Drug use: No  . Sexual activity: Yes  Lifestyle  . Physical activity:    Days per week: Not on file    Minutes per session: Not on file  . Stress: Not on  file  Relationships  . Social connections:    Talks on phone: Not on file    Gets together: Not on file    Attends religious service: Not on file    Active member of club or organization: Not on file    Attends meetings of clubs or organizations: Not on file    Relationship status: Not on file  . Intimate partner violence:    Fear of current or ex partner: Not on file    Emotionally abused: Not on file    Physically abused: Not on file    Forced sexual activity: Not on file  Other Topics Concern  . Not on file  Social History Narrative   Married with 2 sons aged 69 and 89yo.  No alcohol and no tobacco.  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    PHYSICAL EXAM:    Vitals:   12/03/17 0814  BP: 163/82  Pulse: 76  Temp: 36.2 C (97.2 F)   Body mass index is 32.77 kg/m. Weight: 83.9 kg (185 lb)   GENERAL: Alert, active, oriented x3  HEENT: Pupils equal reactive to light. Extraocular movements are intact. Sclera clear. Palpebral conjunctiva normal red color.Pharynx clear.  NECK: Supple with no palpable mass and no adenopathy.  LUNGS: Sound clear with no rales rhonchi or wheezes.  HEART: Regular rhythm S1 and S2 without murmur.  ABDOMEN: Soft and depressible, nontender with no palpable mass, no hepatomegaly.   EXTREMITIES: Well-developed well-nourished symmetrical with no dependent edema.  NEUROLOGICAL: Awake alert oriented, facial expression symmetrical, moving all extremities.  REVIEW OF DATA: I have reviewed the following data today:      Office Visit on 11/26/2017  Component Date Value  . Thyroid Stimulating Horm* 11/26/2017 1.498   . WBC (White Blood Cell Co* 11/26/2017 9.9   . RBC (Red Blood Cell Coun* 11/26/2017 4.30   . Hemoglobin 11/26/2017 11.1*  . Hematocrit 11/26/2017 35.8   . MCV (Mean Corpuscular Vo* 11/26/2017 83.3   . MCH (Mean Corpuscular He* 11/26/2017 25.8*  . MCHC (Mean Corpuscular H* 11/26/2017 31.0*   . Platelet Count 11/26/2017 347   . RDW-CV (Red Cell Distrib* 11/26/2017 13.1   . MPV (Mean Platelet Volum* 11/26/2017 10.5   . Neutrophils 11/26/2017 7.62   . Lymphocytes 11/26/2017 1.49   . Monocytes 11/26/2017 0.42   . Eosinophils 11/26/2017 0.25   . Basophils 11/26/2017 0.06   . Neutrophil % 11/26/2017 77.2*  . Lymphocyte % 11/26/2017 15.1   . Monocyte % 11/26/2017 4.3   . Eosinophil % 11/26/2017 2.5   . Basophil% 11/26/2017 0.6   . Immature Granulocyte % 11/26/2017 0.3   . Immature Granulocyte Cou* 11/26/2017 0.03   . Glucose 11/26/2017 98   . Sodium 11/26/2017 141   . Potassium 11/26/2017 3.7   . Chloride 11/26/2017 106   .  Carbon Dioxide (CO2) 11/26/2017 26.2   . Urea Nitrogen (BUN) 11/26/2017 14   . Creatinine 11/26/2017 0.8   . Glomerular Filtration Ra* 11/26/2017 75   . Calcium 11/26/2017 9.9   . AST  11/26/2017 19   . ALT  11/26/2017 16   . Alk Phos (alkaline Phosp* 11/26/2017 67   . Albumin 11/26/2017 4.2   . Bilirubin, Total 11/26/2017 0.4   . Protein, Total 11/26/2017 6.9   . A/G Ratio 11/26/2017 1.6   . Color 11/26/2017 Yellow   . Clarity 11/26/2017 Clear   . Specific Gravity 11/26/2017 1.025   . pH, Urine 11/26/2017 6.0   . Protein, Urinalysis 11/26/2017 Negative   . Glucose, Urinalysis 11/26/2017 Negative   . Ketones, Urinalysis 11/26/2017 Trace*  . Blood, Urinalysis 11/26/2017 Trace*  . Nitrite, Urinalysis 11/26/2017 Negative   . Leukocyte Esterase, Urin* 11/26/2017 Negative   . White Blood Cells, Urina* 11/26/2017 0-3   . Red Blood Cells, Urinaly* 11/26/2017 None Seen   . Bacteria, Urinalysis 11/26/2017 Few*  . Squamous Epithelial Cell* 11/26/2017 Rare     Abdominal sonogram images reviewed and found with multiple large liver cyst and a very small gallbladder. No pericholecystic fluid and no gallbladder wall thickening.   ASSESSMENT: Ms. Luckow is a 52 y.o. female presenting for consultation for cholelithiasis.  Patient with symptoms that are  associated with gallstone (right upper quadrant pain associated with food intake) and other non specific symptoms such as nausea and vomiting. Patient was oriented about cholelithiasis, its implications and its treatment alternative (observation vs cholecystectomy). Patient was oriented about the surgical techniques (open vs laparoscopic) and its risk of complications: bile duct injury, bile leak, bleeding, infection, retained stone, injury to adjacent organs, bowel obstruction among others. Patient was also oriented that the non specific symptoms may be caused by other conditions such as gastritis, GERD that can continue despite having the cholecystectomy. Patient understood and want to proceed with surgery. Patient has multiple family members with gallbladder disease that resolved with cholecystectomy (mother, father and sister)  PLAN: 1. Laparoscopic cholecystectomy 2. CBC, CMP - done 3. Internal medicine clearance.  4. Avoid fatty food 5. Do not take aspirin 5 days before surgery.   Patient verbalized understanding, all questions were answered, and were agreeable with the plan outlined above.   Herbert Pun, MD  Electronically signed by Herbert Pun, MD

## 2017-12-05 ENCOUNTER — Other Ambulatory Visit: Payer: Self-pay

## 2017-12-05 ENCOUNTER — Encounter
Admission: RE | Admit: 2017-12-05 | Discharge: 2017-12-05 | Disposition: A | Discharge status: Home / Self Care | Payer: 59 | Source: Ambulatory Visit | Attending: General Surgery | Admitting: General Surgery

## 2017-12-05 DIAGNOSIS — Z01818 Encounter for other preprocedural examination: Secondary | ICD-10-CM | POA: Insufficient documentation

## 2017-12-05 HISTORY — DX: Gastro-esophageal reflux disease without esophagitis: K21.9

## 2017-12-05 NOTE — Patient Instructions (Signed)
Your procedure is scheduled on: Wednesday, February 20 Report to [facility] FLO[facility]  To find out your arrival time please call [phone] between 1PM - 3PM on Tuesday, February 19  Remember: Instructions that are not followed completely may result in serious  medical risk, up to and including death, or upon the discretion of your surgeon  and anesthesiologist your surgery may need to be rescheduled.     _X__ 1. Do not eat food after midnight the night before your procedure.                 No gum chewing, lozengers or hard candies.                   You may drink clear liquids up to 2 hours                 before you are scheduled to arrive for your surgery-                   Clear Liquids include:  water, apple juice without pulp, clear carbohydrate                 drink such as Clearfast of Gartorade, Black Coffee or Tea (Do not add                 anything to coffee or tea).  __X__2.  On the morning of surgery brush your teeth with toothpaste and water,                        you may rinse your mouth with mouthwash if you wish.                                Do not swallow any toothpaste of mouthwash.     _X__ 3.  No Alcohol for 24 hours before or after surgery.   _X__ 4.  Do Not Smoke or use e-cigarettes For 24 Hours Prior to Your Surgery.                 Do not use any chewable tobacco products for at least 6 hours prior to                 surgery.  ____  5.  Bring all medications with you on the day of surgery if instructed.   ____  6.  Notify your doctor if there is any change in your medical condition      (cold, fever, infections).     Do not wear jewelry, make-up, hairpins, clips or nail polish. Do not wear lotions, powders, or perfumes. You may wear deodorant. Do not shave 48 hours prior to surgery. Men may shave face and neck. Do not bring valuables to the hospital.    [facility] is not responsible for any belongings  or valuables.  Contacts, dentures or bridgework may not be worn into surgery. Leave your suitcase in the car. After surgery it may be brought to your room. For patients admitted to the hospital, discharge time is determined by your treatment team.   Patients discharged the day of surgery will not be allowed to drive home.   Please read over the following fact sheets that you were given:   Preparing for surgery   ____ Take these medicines the morning of surgery with A  SIP OF WATER:    1. None  2.   3.   4.  5.  6.  ____ Fleet Enema (as directed)   ___x_ Use CHG Soap as directed  ____ Use inhalers on the day of surgery  ____ Stop ASPIRIN PRODUCTS NOW!!  ____ Stop Anti-inflammatories NOW!!               THIS INCLUDES IBUPROFEN / MOTRIN / ADVIL / ALEVE                      NAPROSYN               YOU MAY TAKE TYLENOL   ____ Stop supplements until after surgery.    ____ Bring C-Pap to the hospital.   HAVE LOOSE FITTING CLOTHES FOR DISCHARGE  HAVE STOOL SOFTENERS AVAILABLE ONCE HOME  NO CONTACTS ON DAY OF SURGERY

## 2017-12-10 MED ORDER — CEFAZOLIN SODIUM-DEXTROSE 2-4 GM/100ML-% IV SOLN
2.0000 g | INTRAVENOUS | Status: AC
  Administered 2017-12-11: 2 g via INTRAVENOUS

## 2017-12-11 ENCOUNTER — Ambulatory Visit: Payer: Commercial Managed Care - HMO | Admitting: Anesthesiology

## 2017-12-11 ENCOUNTER — Ambulatory Visit
Admission: RE | Admit: 2017-12-11 | Discharge: 2017-12-11 | Disposition: A | Discharge status: Home / Self Care | Payer: Commercial Managed Care - HMO | Source: Ambulatory Visit | Attending: General Surgery | Admitting: General Surgery

## 2017-12-11 ENCOUNTER — Other Ambulatory Visit: Payer: Self-pay

## 2017-12-11 ENCOUNTER — Encounter
Admission: RE | Disposition: A | Discharge status: Home / Self Care | Payer: Self-pay | Source: Ambulatory Visit | Attending: General Surgery

## 2017-12-11 DIAGNOSIS — Z79899 Other long term (current) drug therapy: Secondary | ICD-10-CM | POA: Insufficient documentation

## 2017-12-11 DIAGNOSIS — K8010 Calculus of gallbladder with chronic cholecystitis without obstruction: Secondary | ICD-10-CM | POA: Insufficient documentation

## 2017-12-11 DIAGNOSIS — D509 Iron deficiency anemia, unspecified: Secondary | ICD-10-CM | POA: Insufficient documentation

## 2017-12-11 DIAGNOSIS — K8020 Calculus of gallbladder without cholecystitis without obstruction: Secondary | ICD-10-CM | POA: Diagnosis not present

## 2017-12-11 DIAGNOSIS — I10 Essential (primary) hypertension: Secondary | ICD-10-CM | POA: Insufficient documentation

## 2017-12-11 DIAGNOSIS — K219 Gastro-esophageal reflux disease without esophagitis: Secondary | ICD-10-CM | POA: Diagnosis not present

## 2017-12-11 HISTORY — PX: CHOLECYSTECTOMY: SHX55

## 2017-12-11 SURGERY — LAPAROSCOPIC CHOLECYSTECTOMY
Anesthesia: General | Wound class: Clean Contaminated

## 2017-12-11 MED ORDER — SUGAMMADEX SODIUM 200 MG/2ML IV SOLN
INTRAVENOUS | Status: AC
  Filled 2017-12-11: qty 2

## 2017-12-11 MED ORDER — FENTANYL CITRATE (PF) 100 MCG/2ML IJ SOLN
INTRAMUSCULAR | Status: AC
  Administered 2017-12-11: 25 ug via INTRAVENOUS
  Filled 2017-12-11: qty 2

## 2017-12-11 MED ORDER — ONDANSETRON HCL 4 MG/2ML IJ SOLN
4.0000 mg | Freq: Once | INTRAMUSCULAR | Status: DC | PRN
Start: 2017-12-11 — End: 2017-12-11

## 2017-12-11 MED ORDER — FAMOTIDINE 20 MG PO TABS
20.0000 mg | ORAL_TABLET | Freq: Once | ORAL | Status: AC
  Administered 2017-12-11: 20 mg via ORAL

## 2017-12-11 MED ORDER — DEXMEDETOMIDINE HCL 200 MCG/2ML IV SOLN: INTRAVENOUS | Status: DC | PRN

## 2017-12-11 MED ORDER — ROCURONIUM BROMIDE 100 MG/10ML IV SOLN
INTRAVENOUS | Status: DC | PRN
  Administered 2017-12-11: 5 mg via INTRAVENOUS
  Administered 2017-12-11: 25 mg via INTRAVENOUS

## 2017-12-11 MED ORDER — FENTANYL CITRATE (PF) 100 MCG/2ML IJ SOLN
INTRAMUSCULAR | Status: AC
  Filled 2017-12-11: qty 2

## 2017-12-11 MED ORDER — PHENYLEPHRINE HCL 10 MG/ML IJ SOLN
INTRAMUSCULAR | Status: DC | PRN
  Administered 2017-12-11: 80 ug via INTRAVENOUS
  Administered 2017-12-11: 40 ug via INTRAVENOUS

## 2017-12-11 MED ORDER — CEFAZOLIN SODIUM-DEXTROSE 2-4 GM/100ML-% IV SOLN
INTRAVENOUS | Status: AC
  Filled 2017-12-11: qty 100

## 2017-12-11 MED ORDER — SEVOFLURANE IN SOLN
RESPIRATORY_TRACT | Status: AC
Start: 2017-12-11 — End: ?
  Filled 2017-12-11: qty 250

## 2017-12-11 MED ORDER — BUPIVACAINE HCL (PF) 0.5 % IJ SOLN
INTRAMUSCULAR | Status: AC
  Filled 2017-12-11: qty 30

## 2017-12-11 MED ORDER — PROPOFOL 10 MG/ML IV BOLUS
INTRAVENOUS | Status: DC | PRN
  Administered 2017-12-11: 50 mg via INTRAVENOUS
  Administered 2017-12-11: 150 mg via INTRAVENOUS

## 2017-12-11 MED ORDER — ROCURONIUM BROMIDE 50 MG/5ML IV SOLN
INTRAVENOUS | Status: AC
  Filled 2017-12-11: qty 1

## 2017-12-11 MED ORDER — DEXAMETHASONE SODIUM PHOSPHATE 10 MG/ML IJ SOLN
INTRAMUSCULAR | Status: DC | PRN
  Administered 2017-12-11: 10 mg via INTRAVENOUS

## 2017-12-11 MED ORDER — PROPOFOL 10 MG/ML IV BOLUS
INTRAVENOUS | Status: AC
  Filled 2017-12-11: qty 20

## 2017-12-11 MED ORDER — DEXMEDETOMIDINE HCL 200 MCG/2ML IV SOLN
INTRAVENOUS | Status: DC | PRN
  Administered 2017-12-11: 12 ug via INTRAVENOUS

## 2017-12-11 MED ORDER — SUGAMMADEX SODIUM 200 MG/2ML IV SOLN
INTRAVENOUS | Status: DC | PRN
  Administered 2017-12-11: 162.4 mg via INTRAVENOUS

## 2017-12-11 MED ORDER — ONDANSETRON HCL 4 MG/2ML IJ SOLN
INTRAMUSCULAR | Status: DC | PRN
  Administered 2017-12-11: 4 mg via INTRAVENOUS

## 2017-12-11 MED ORDER — ACETAMINOPHEN 10 MG/ML IV SOLN
INTRAVENOUS | Status: AC
Start: 2017-12-11 — End: ?
  Filled 2017-12-11: qty 100

## 2017-12-11 MED ORDER — ONDANSETRON HCL 4 MG/2ML IJ SOLN
INTRAMUSCULAR | Status: AC
  Filled 2017-12-11: qty 2

## 2017-12-11 MED ORDER — SUCCINYLCHOLINE CHLORIDE 20 MG/ML IJ SOLN
INTRAMUSCULAR | Status: AC
  Filled 2017-12-11: qty 2

## 2017-12-11 MED ORDER — SUCCINYLCHOLINE CHLORIDE 20 MG/ML IJ SOLN
INTRAMUSCULAR | Status: DC | PRN
  Administered 2017-12-11: 100 mg via INTRAVENOUS

## 2017-12-11 MED ORDER — TRAMADOL HCL 50 MG PO TABS: 50.0000 mg | ORAL_TABLET | Freq: Four times a day (QID) | ORAL | 0 refills | Status: AC | PRN

## 2017-12-11 MED ORDER — FENTANYL CITRATE (PF) 250 MCG/5ML IJ SOLN
INTRAMUSCULAR | Status: AC
  Filled 2017-12-11: qty 5

## 2017-12-11 MED ORDER — EPINEPHRINE PF 1 MG/ML IJ SOLN
INTRAMUSCULAR | Status: AC
  Filled 2017-12-11: qty 1

## 2017-12-11 MED ORDER — MIDAZOLAM HCL 2 MG/2ML IJ SOLN
INTRAMUSCULAR | Status: AC
  Filled 2017-12-11: qty 2

## 2017-12-11 MED ORDER — FENTANYL CITRATE (PF) 100 MCG/2ML IJ SOLN
INTRAMUSCULAR | Status: DC | PRN
  Administered 2017-12-11 (×2): 100 ug via INTRAVENOUS
  Administered 2017-12-11: 50 ug via INTRAVENOUS

## 2017-12-11 MED ORDER — MIDAZOLAM HCL 2 MG/2ML IJ SOLN
INTRAMUSCULAR | Status: DC | PRN
  Administered 2017-12-11: 2 mg via INTRAVENOUS

## 2017-12-11 MED ORDER — FAMOTIDINE 20 MG PO TABS
ORAL_TABLET | ORAL | Status: AC
  Administered 2017-12-11: 20 mg via ORAL
  Filled 2017-12-11: qty 1

## 2017-12-11 MED ORDER — LIDOCAINE 2% (20 MG/ML) 5 ML SYRINGE
INTRAMUSCULAR | Status: DC | PRN
  Administered 2017-12-11: 100 mg via INTRAVENOUS

## 2017-12-11 MED ORDER — ACETAMINOPHEN 10 MG/ML IV SOLN
INTRAVENOUS | Status: DC | PRN
  Administered 2017-12-11: 1000 mg via INTRAVENOUS

## 2017-12-11 MED ORDER — KETOROLAC TROMETHAMINE 30 MG/ML IJ SOLN
INTRAMUSCULAR | Status: DC | PRN
  Administered 2017-12-11: 30 mg via INTRAVENOUS

## 2017-12-11 MED ORDER — FENTANYL CITRATE (PF) 100 MCG/2ML IJ SOLN
25.0000 ug | INTRAMUSCULAR | Status: DC | PRN
  Administered 2017-12-11 (×5): 25 ug via INTRAVENOUS

## 2017-12-11 MED ORDER — LIDOCAINE HCL (PF) 2 % IJ SOLN
INTRAMUSCULAR | Status: AC
Start: 2017-12-11 — End: ?
  Filled 2017-12-11: qty 10

## 2017-12-11 MED ORDER — DEXAMETHASONE SODIUM PHOSPHATE 10 MG/ML IJ SOLN
INTRAMUSCULAR | Status: AC
  Filled 2017-12-11: qty 1

## 2017-12-11 MED ORDER — LACTATED RINGERS IV SOLN
INTRAVENOUS | Status: DC
  Administered 2017-12-11 (×2): via INTRAVENOUS

## 2017-12-11 SURGICAL SUPPLY — 37 items
APPLIER CLIP LOGIC TI 5 (MISCELLANEOUS) ×2 IMPLANT
BLADE SURG SZ11 CARB STEEL (BLADE) ×2 IMPLANT
CANISTER SUCT 1200ML W/VALVE (MISCELLANEOUS) ×2 IMPLANT
CHLORAPREP W/TINT 26ML (MISCELLANEOUS) ×2 IMPLANT
DERMABOND ADVANCED (GAUZE/BANDAGES/DRESSINGS) ×1
DERMABOND ADVANCED .7 DNX12 (GAUZE/BANDAGES/DRESSINGS) ×1 IMPLANT
DRAPE SHEET LG 3/4 BI-LAMINATE (DRAPES) ×2 IMPLANT
ELECT E-Z MONOPOLAR 33 (MISCELLANEOUS) ×2
ELECT REM PT RETURN 9FT ADLT (ELECTROSURGICAL) ×2
ELECTRODE E-Z MONOPOLAR 33 (MISCELLANEOUS) ×1 IMPLANT
ELECTRODE REM PT RTRN 9FT ADLT (ELECTROSURGICAL) ×1 IMPLANT
GAUZE SPONGE 4X4 12PLY STRL (GAUZE/BANDAGES/DRESSINGS) ×2 IMPLANT
GLOVE BIO SURGEON STRL SZ 6.5 (GLOVE) ×2 IMPLANT
GOWN STRL REUS W/ TWL LRG LVL3 (GOWN DISPOSABLE) ×4 IMPLANT
GOWN STRL REUS W/TWL LRG LVL3 (GOWN DISPOSABLE) ×4
GRASPER SUT TROCAR 14GX15 (MISCELLANEOUS) IMPLANT
HEMOSTAT SURGICEL 2X3 (HEMOSTASIS) IMPLANT
IRRIGATION STRYKERFLOW (MISCELLANEOUS) ×1 IMPLANT
IRRIGATOR STRYKERFLOW (MISCELLANEOUS) ×2
IV NS 1000ML (IV SOLUTION) ×1
IV NS 1000ML BAXH (IV SOLUTION) ×1 IMPLANT
KIT TURNOVER KIT A (KITS) ×2 IMPLANT
LABEL OR SOLS (LABEL) ×2 IMPLANT
NEEDLE HYPO 25X1 1.5 SAFETY (NEEDLE) ×2 IMPLANT
NS IRRIG 500ML POUR BTL (IV SOLUTION) ×2 IMPLANT
PACK LAP CHOLECYSTECTOMY (MISCELLANEOUS) ×2 IMPLANT
PENCIL ELECTRO HAND CTR (MISCELLANEOUS) IMPLANT
POUCH SPECIMEN RETRIEVAL 10MM (ENDOMECHANICALS) ×2 IMPLANT
SCISSORS METZENBAUM CVD 33 (INSTRUMENTS) ×2 IMPLANT
SLEEVE ENDOPATH XCEL 5M (ENDOMECHANICALS) ×4 IMPLANT
SUT MNCRL AB 4-0 PS2 18 (SUTURE) ×2 IMPLANT
SUT VIC AB 0 CT1 36 (SUTURE) IMPLANT
SUT VICRYL 0 AB UR-6 (SUTURE) ×2 IMPLANT
TROCAR XCEL NON-BLD 11X100MML (ENDOMECHANICALS) ×2 IMPLANT
TROCAR XCEL NON-BLD 5MMX100MML (ENDOMECHANICALS) ×2 IMPLANT
TUBING INSUF HEATED (TUBING) ×2 IMPLANT
TUBING INSUFFLATION (TUBING) ×2 IMPLANT

## 2017-12-11 NOTE — Anesthesia Post-op Follow-up Note (Signed)
Anesthesia QCDR form completed.        

## 2017-12-11 NOTE — Transfer of Care (Signed)
Immediate Anesthesia Transfer of Care Note  Patient: Jessica Harmon  Procedure(s) Performed: LAPAROSCOPIC CHOLECYSTECTOMY (N/A )  Patient Location: PACU  Anesthesia Type:General  Level of Consciousness: awake, alert  and oriented  Airway & Oxygen Therapy: Patient Spontanous Breathing and Patient connected to nasal cannula oxygen  Post-op Assessment: Report given to RN and Post -op Vital signs reviewed and stable  Post vital signs: Reviewed and stable  Last Vitals:  Vitals:   12/11/17 0607 12/11/17 0609  BP:  140/86  Pulse: 91   Resp: 16   Temp: (!) 36.3 C   SpO2: 100%     Last Pain:  Vitals:   12/11/17 0607  TempSrc: Tympanic         Complications: No apparent anesthesia complications

## 2017-12-11 NOTE — Anesthesia Preprocedure Evaluation (Signed)
Anesthesia Evaluation  Patient identified by MRN, date of birth, ID band Patient awake    Reviewed: Allergy & Precautions, NPO status , Patient's Chart, lab work & pertinent test results, reviewed documented beta blocker date and time   Airway Mallampati: III  TM Distance: >3 FB     Dental  (+) Chipped   Pulmonary           Cardiovascular hypertension, Pt. on medications      Neuro/Psych    GI/Hepatic GERD  Controlled,  Endo/Other    Renal/GU      Musculoskeletal   Abdominal   Peds  Hematology   Anesthesia Other Findings   Reproductive/Obstetrics                             Anesthesia Physical Anesthesia Plan  ASA: II  Anesthesia Plan: General   Post-op Pain Management:    Induction: Intravenous  PONV Risk Score and Plan:   Airway Management Planned: Oral ETT  Additional Equipment:   Intra-op Plan:   Post-operative Plan:   Informed Consent: I have reviewed the patients History and Physical, chart, labs and discussed the procedure including the risks, benefits and alternatives for the proposed anesthesia with the patient or authorized representative who has indicated his/her understanding and acceptance.     Plan Discussed with: CRNA  Anesthesia Plan Comments:         Anesthesia Quick Evaluation

## 2017-12-11 NOTE — Discharge Instructions (Signed)
° ° °  AMBULATORY SURGERY  DISCHARGE INSTRUCTIONS   1) The drugs that you were given will stay in your system until tomorrow so for the next 24 hours you should not:  A) Drive an automobile B) Make any legal decisions C) Drink any alcoholic beverage   2) You may resume regular meals tomorrow.  Today it is better to start with liquids and gradually work up to solid foods.  You may eat anything you prefer, but it is better to start with liquids, then soup and crackers, and gradually work up to solid foods.   3) Please notify your doctor immediately if you have any unusual bleeding, trouble breathing, redness and pain at the surgery site, drainage, fever, or pain not relieved by medication.    4) Additional Instructions:        Please contact your physician with any problems or Same Day Surgery at [phone], Monday through Friday 6 am to 4 pm, or [place] [facility] number at [phone]. Diet: Resume home heart healthy low fat regular diet.   Activity: No heavy lifting >20 pounds (children, pets, laundry, garbage) or strenuous activity until follow-up, but light activity and walking are encouraged. Do not drive or drink alcohol if taking narcotic pain medications.  Wound care: May shower with soapy water and pat dry (do not rub incisions), but no baths or submerging incision underwater until follow-up. (no swimming)   Medications: Resume all home medications. For mild to moderate pain: acetaminophen (Tylenol) or ibuprofen (if no kidney disease). Combining Tylenol with alcohol can substantially increase your risk of causing liver disease. Narcotic pain medications, if prescribed, can be used for severe pain, though may cause nausea, constipation, and drowsiness. Do not combine Tylenol and Percocet within a 6 hour period as Percocet contains Tylenol. If you do not need the narcotic pain medication, you do not need to fill the prescription.  Need to take stool  softener/laxative if pain medication cause constipation. Example: Miralax, Dulcolax, etc.   Call office [phone]) at any time if any questions, worsening pain, fevers/chills, bleeding, drainage from incision site, or other concerns.

## 2017-12-11 NOTE — Op Note (Signed)
Preoperative diagnosis: Symptomatic cholelithiasis  Postoperative diagnosis: Symptomatic cholelithiasis.  Procedure: Laparoscopic Cholecystectomy.   Anesthesia: GETA   Surgeon: Dr. Windell Moment  Wound Classification: Clean Contaminated  Indications: Patient is a 52 y.o. female developed ight upper quadrant pain and nausea associated with oral intake and on workup was found to have cholelithiasis with a normal common duct and multiple big simple cyst. Patient with normal liver enzymes. Laparoscopic cholecystectomy was elected.  Findings: Critical view of safety achieved Cystic duct and artery identified, ligated and divided Multiple liver cyst. Only the one just adjacent to the gallbladder was drained having clear fluid.  Adequate hemostasis  Description of procedure: The patient was placed on the operating table in the supine position. General anesthesia was induced. A time-out was completed verifying correct patient, procedure, site, positioning, and implant(s) and/or special equipment prior to beginning this procedure. An orogastric tube was placed. The abdomen was prepped and draped in the usual sterile fashion.  An incision was made in a natural skin line below the umbilicus.  The fascia was elevated and the Veress needle inserted. Proper position was confirmed by aspiration and saline meniscus test.  The abdomen was insufflated with carbon dioxide to a pressure of 15 mmHg. The patient tolerated insufflation well. A 11-mm trocar was then inserted using and Optiview trocar.  The laparoscope was inserted and the abdomen inspected. No injuries from initial trocar placement were noted. Additional trocars were then inserted in the following locations: a 5-mm trocar in the right epigastrium and two 5-mm trocars along the right costal margin. The abdomen was inspected and no abnormalities were found. The table was placed in the reverse Trendelenburg position with the right side up.  Filmy  adhesions between the gallbladder and omentum, duodenum and transverse colon were lysed sharply. The dome of the gallbladder was grasped with an atraumatic grasper passed through the lateral port and retracted over the dome of the liver. The infundibulum was also grasped with an atraumatic grasper through the midclavicular port and retracted toward the right lower quadrant. A medium size cyst medial to the gallbladder was drained and clear fluid was aspirated.  This maneuver exposed Calot's triangle. The peritoneum overlying the gallbladder infundibulum was then incised and the cystic duct and cystic artery identified and circumferentially dissected. The cystic duct and cystic artery were then doubly clipped and divided close to the gallbladder.  The gallbladder was then dissected from its peritoneal attachments by electrocautery. Hemostasis was checked and the gallbladder and contained stones were removed using an endoscopic retrieval bag placed through the umbilical port. The gallbladder was passed off the table as a specimen. The gallbladder fossa was copiously irrigated with saline and hemostasis was obtained. There was no evidence of bleeding from the gallbladder fossa or cystic artery or leakage of the bile from the cystic duct stump. Secondary trocars were removed under direct vision. No bleeding was noted. The laparoscope was withdrawn and the umbilical trocar removed. The abdomen was allowed to collapse. The fascia of the 17mm trocar sites was closed with figure-of-eight 0 vicryl sutures. The skin was closed with subcuticular sutures of 4-0 monocryl and topical skin adhesive. The orogastric tube was removed.  The patient tolerated the procedure well and was taken to the postanesthesia care unit in stable condition.   Specimen: Gallbladder  Complications: None  EBL: 71mL

## 2017-12-11 NOTE — Anesthesia Procedure Notes (Signed)
Procedure Name: Intubation Date/Time: 12/11/2017 7:46 AM Performed by: Marsh Dolly, CRNA Pre-anesthesia Checklist: Patient identified, Patient being monitored, Timeout performed, Emergency Drugs available and Suction available Patient Re-evaluated:Patient Re-evaluated prior to induction Oxygen Delivery Method: Circle system utilized Preoxygenation: Pre-oxygenation with 100% oxygen Induction Type: IV induction Ventilation: Mask ventilation without difficulty Laryngoscope Size: 3 and Miller Grade View: Grade I Tube type: Oral Tube size: 7.0 mm Number of attempts: 1 Placement Confirmation: ETT inserted through vocal cords under direct vision,  positive ETCO2 and breath sounds checked- equal and bilateral Secured at: 21 cm Tube secured with: Tape Dental Injury: Teeth and Oropharynx as per pre-operative assessment

## 2017-12-11 NOTE — Interval H&P Note (Signed)
History and Physical Interval Note:  12/11/2017 7:12 AM  Jessica Harmon  has presented today for surgery, with the diagnosis of CHOLELITHIASIS  The various methods of treatment have been discussed with the patient and family. After consideration of risks, benefits and other options for treatment, the patient has consented to  Procedure(s): LAPAROSCOPIC CHOLECYSTECTOMY (N/A) as a surgical intervention .  The patient's history has been reviewed, patient examined, no change in status, stable for surgery.  I have reviewed the patient's chart and labs.  Questions were answered to the patient's satisfaction.     Herbert Pun

## 2017-12-11 NOTE — Interval H&P Note (Signed)
History and Physical Interval Note:  12/11/2017 7:14 AM  Jessica Harmon  has presented today for surgery, with the diagnosis of CHOLELITHIASIS  The various methods of treatment have been discussed with the patient and family. After consideration of risks, benefits and other options for treatment, the patient has consented to  Procedure(s): LAPAROSCOPIC CHOLECYSTECTOMY (N/A) as a surgical intervention .  The patient's history has been reviewed, patient examined, no change in status, stable for surgery.  I have reviewed the patient's chart and labs.  Questions were answered to the patient's satisfaction.     Herbert Pun

## 2017-12-11 NOTE — Anesthesia Postprocedure Evaluation (Signed)
Anesthesia Post Note  Patient: Jessica Harmon  Procedure(s) Performed: LAPAROSCOPIC CHOLECYSTECTOMY (N/A )  Patient location during evaluation: PACU Anesthesia Type: General Level of consciousness: awake and alert Pain management: pain level controlled Vital Signs Assessment: post-procedure vital signs reviewed and stable Respiratory status: spontaneous breathing, nonlabored ventilation, respiratory function stable and patient connected to nasal cannula oxygen Cardiovascular status: blood pressure returned to baseline and stable Postop Assessment: no apparent nausea or vomiting Anesthetic complications: no     Last Vitals:  Vitals:   12/11/17 1011 12/11/17 1113  BP: 116/65 117/65  Pulse: 67 73  Resp: 16 18  Temp: (!) 36.2 C   SpO2: 97% 99%    Last Pain:  Vitals:   12/11/17 1113  TempSrc:   PainSc: 3                  Tonantzin Mimnaugh S

## 2017-12-12 LAB — SURGICAL PATHOLOGY

## 2018-02-26 DIAGNOSIS — H5213 Myopia, bilateral: Secondary | ICD-10-CM | POA: Diagnosis not present

## 2018-05-07 DIAGNOSIS — Z1283 Encounter for screening for malignant neoplasm of skin: Secondary | ICD-10-CM | POA: Diagnosis not present

## 2018-05-07 DIAGNOSIS — Z85828 Personal history of other malignant neoplasm of skin: Secondary | ICD-10-CM | POA: Diagnosis not present

## 2018-05-07 DIAGNOSIS — D225 Melanocytic nevi of trunk: Secondary | ICD-10-CM | POA: Diagnosis not present

## 2018-05-07 DIAGNOSIS — L820 Inflamed seborrheic keratosis: Secondary | ICD-10-CM | POA: Diagnosis not present

## 2018-09-05 IMAGING — MG DIGITAL SCREENING BILATERAL MAMMOGRAM WITH CAD
8 series · 8 of 8 positions shown · non-contrast
Comparison: Previous exam(s).

CLINICAL DATA: Screening.

EXAM:
DIGITAL SCREENING BILATERAL MAMMOGRAM WITH CAD

[L CC (1 of 2)]
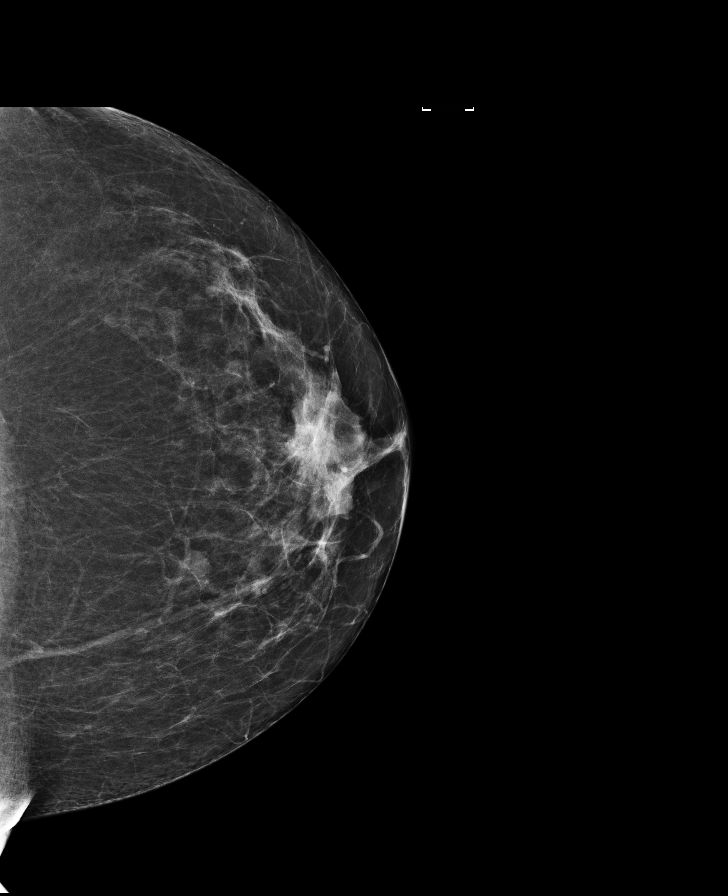

[R MLO (1 of 2)]
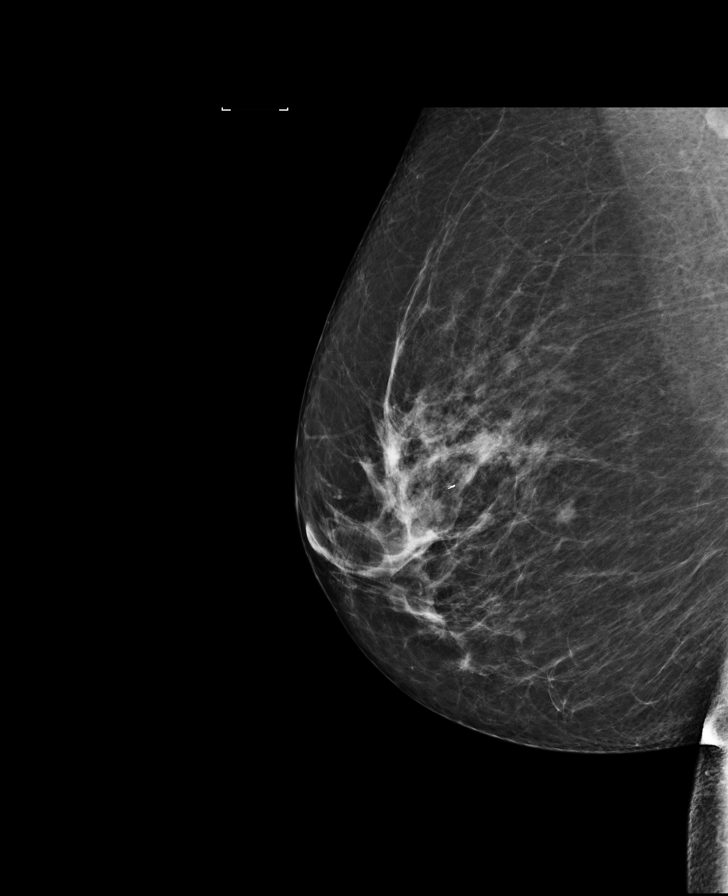

[L MLO (1 of 2)]
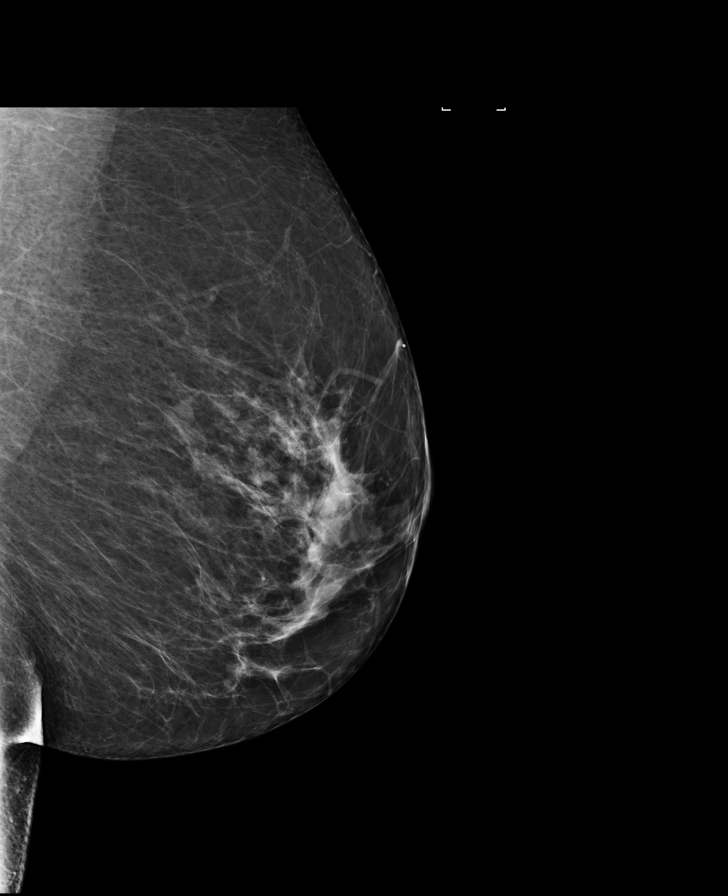

[R CC (1 of 2)]
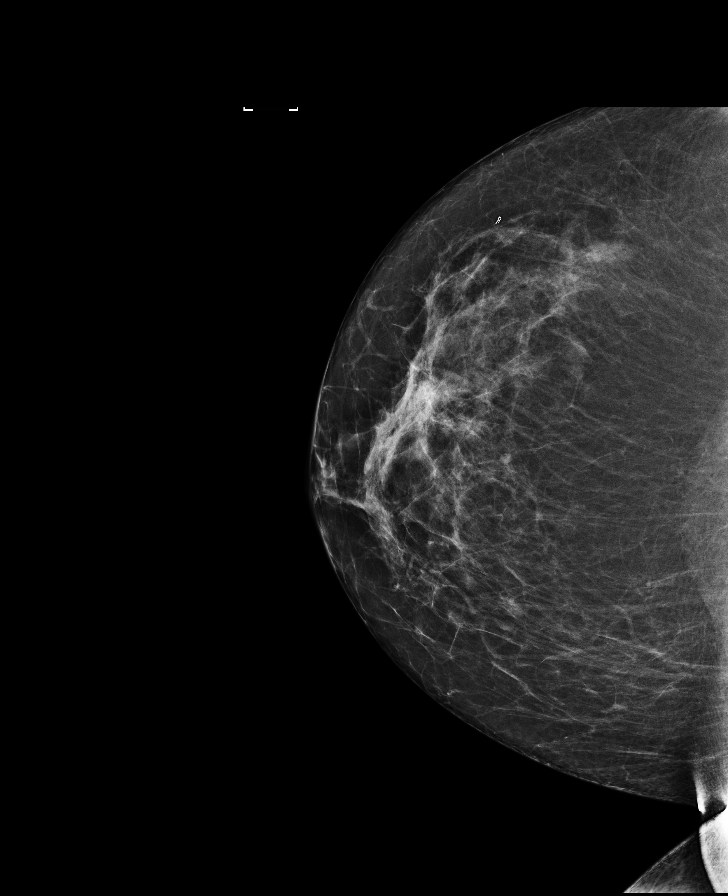

[L MLO (2 of 2)]
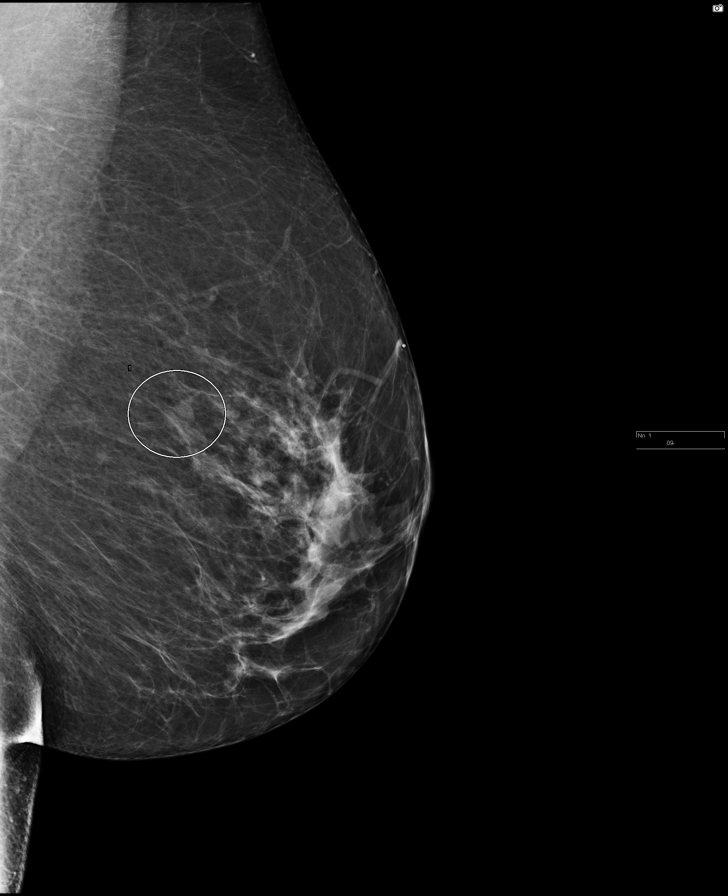

[L CC (2 of 2)]
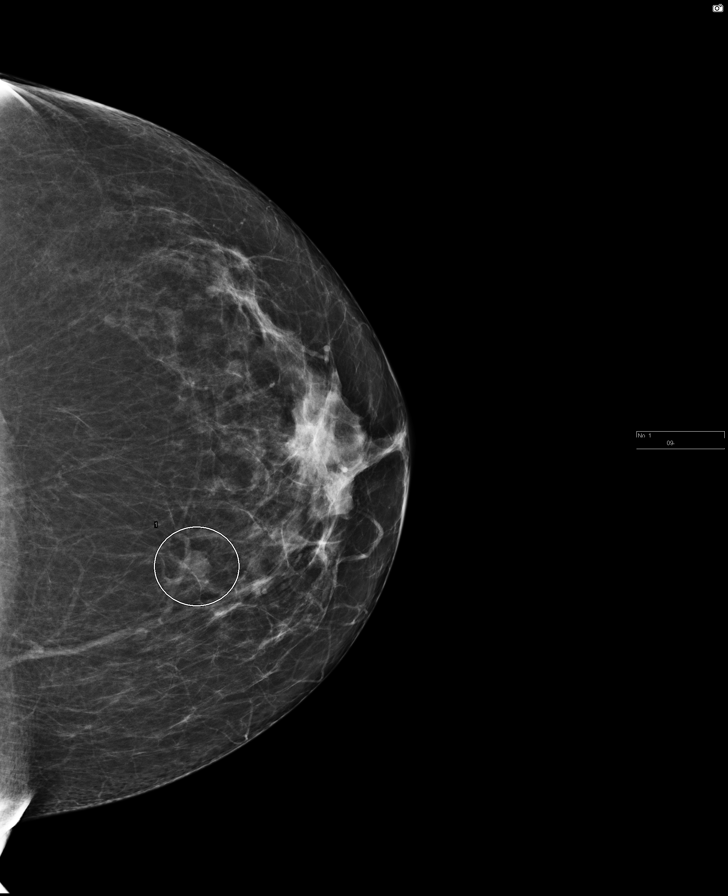

[R CC (2 of 2)]
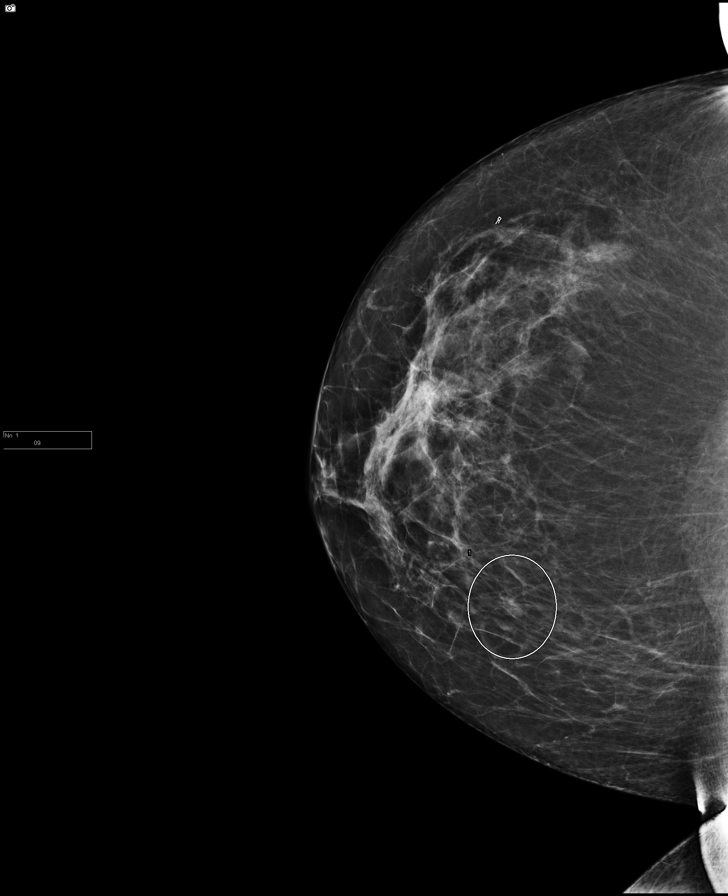

[R MLO (2 of 2)]
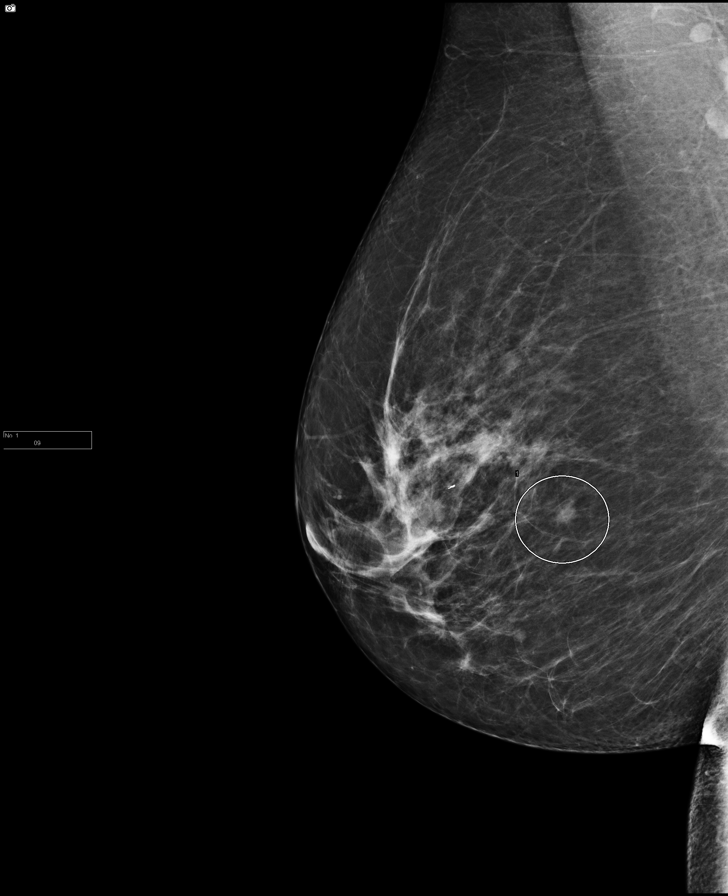

[8 of 8 positions shown; findings below may reference images not displayed]

ACR Breast Density Category c: The breast tissue is heterogeneously
dense, which may obscure small masses.
FINDINGS: In the right breast possible asymmetry requires further evaluation.

In the left breast possible mass requires further evaluation.

Images were processed with CAD.
IMPRESSION: Further evaluation is suggested for possible asymmetry in the right
breast.

Further evaluation is suggested for possible mass in the left
breast.

RECOMMENDATION:
Diagnostic mammogram and possibly ultrasound of both breasts.
(Code:[id number])

The patient will be contacted regarding the findings, and additional
imaging will be scheduled.

BI-RADS CATEGORY  0: Incomplete. Need additional imaging evaluation
and/or prior mammograms for comparison.

## 2018-09-23 ENCOUNTER — Other Ambulatory Visit: Payer: Self-pay | Admitting: Certified Nurse Midwife

## 2018-09-23 DIAGNOSIS — Z1231 Encounter for screening mammogram for malignant neoplasm of breast: Secondary | ICD-10-CM

## 2018-09-25 ENCOUNTER — Ambulatory Visit
Admission: RE | Admit: 2018-09-25 | Discharge: 2018-09-25 | Disposition: A | Discharge status: Home / Self Care | Payer: 59 | Source: Ambulatory Visit | Attending: Certified Nurse Midwife | Admitting: Certified Nurse Midwife

## 2018-09-25 DIAGNOSIS — Z1231 Encounter for screening mammogram for malignant neoplasm of breast: Secondary | ICD-10-CM | POA: Diagnosis not present

## 2018-10-29 ENCOUNTER — Ambulatory Visit: Payer: 59 | Admitting: Certified Nurse Midwife

## 2018-11-12 ENCOUNTER — Other Ambulatory Visit (HOSPITAL_COMMUNITY)
Admission: RE | Admit: 2018-11-12 | Discharge: 2018-11-12 | Disposition: A | Discharge status: Home / Self Care | Payer: 59 | Source: Ambulatory Visit | Attending: Certified Nurse Midwife | Admitting: Certified Nurse Midwife

## 2018-11-12 ENCOUNTER — Ambulatory Visit (INDEPENDENT_AMBULATORY_CARE_PROVIDER_SITE_OTHER): Payer: 59 | Admitting: Certified Nurse Midwife

## 2018-11-12 ENCOUNTER — Encounter: Payer: Self-pay | Admitting: Certified Nurse Midwife

## 2018-11-12 VITALS — BP 118/72 | HR 89 | Ht 63.0 in | Wt 175.0 lb

## 2018-11-12 DIAGNOSIS — Z01419 Encounter for gynecological examination (general) (routine) without abnormal findings: Secondary | ICD-10-CM | POA: Insufficient documentation

## 2018-11-12 DIAGNOSIS — Z124 Encounter for screening for malignant neoplasm of cervix: Secondary | ICD-10-CM | POA: Insufficient documentation

## 2018-11-12 DIAGNOSIS — I10 Essential (primary) hypertension: Secondary | ICD-10-CM | POA: Insufficient documentation

## 2018-11-12 DIAGNOSIS — C44629 Squamous cell carcinoma of skin of left upper limb, including shoulder: Secondary | ICD-10-CM | POA: Insufficient documentation

## 2018-11-12 NOTE — Progress Notes (Signed)
Gynecology Annual Exam  PCP: Marinda Elk, MD  Chief Complaint:  Chief Complaint  Patient presents with  . Gynecologic Exam    History of Present Illness:Jessica Harmon is a 53 year old Caucasian/White female, G4 P2022, who presents for her annual exam. She is having no significant GYN problems.  Her menses are absent since 09/2012 and she is postmenopausal. She does not have difficulty sleeping, hot flashes, and vaginal dryness.  She has had no spotting.   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 She had a right breast bx in 12/13 which was benign..  Since her last annual GYN exam dated 06/12/2017, she has had a cholecystectomy which has decreased her heartburn and reflux symptoms. She still does take Tums ultra 3-4x/week with relief.  She is sexually active.  Her most recent pap smear was obtained 06/12/2017 and was negative  Her most recent mammogram obtained on 09/25/2018 which was negative.  There is a positive history of breast cancer in her paternal aunt. Genetic testing has not been done.  There is no family history of ovarian cancer.  The patient does do occ monthly self breast exams.  Has not had a colonoscopy. Plans on making appointment with Dr Collene Mares in [place] for colonoscopy. The patient does not smoke.  The patient rarely drinks alcohol.  The patient does not use illegal drugs.  The patient exercises by biking on a recumbent bike 2 miles/3-4x/week. The patient does not get adequate calcium in her diet.  She had a recent cholesterol screen in 2017 by PCP Boykin Reaper at [facility] that was normal.    The patient denies current symptoms of depression.    Review of Systems: Review of Systems  Constitutional: Negative for chills, fever and weight loss.  HENT: Negative for congestion, sinus pain and sore throat.   Eyes: Negative for blurred vision and pain.  Respiratory:  Negative for hemoptysis, shortness of breath and wheezing.   Cardiovascular: Negative for chest pain, palpitations and leg swelling.  Gastrointestinal: Positive for heartburn. Negative for abdominal pain, blood in stool, diarrhea, nausea and vomiting.  Genitourinary: Negative for dysuria, frequency, hematuria and urgency.  Musculoskeletal: Negative for back pain, joint pain and myalgias.  Skin: Negative for itching and rash.  Neurological: Negative for dizziness, tingling and headaches.  Endo/Heme/Allergies: Negative for environmental allergies and polydipsia. Does not bruise/bleed easily.       Negative for hirsutism   Psychiatric/Behavioral: Negative for depression. The patient is not nervous/anxious and does not have insomnia.     Past Medical History:  Past Medical History:  Diagnosis Date  . GERD (gastroesophageal reflux disease)    especially since gallbladder issues  . Hypertension   . Lump or mass in breast 2013   benign. left breast  . Squamous cell cancer of skin of forearm, left 2017    Past Surgical History:  Past Surgical History:  Procedure Laterality Date  . BREAST BIOPSY Right 2013   needle  . CESAREAN SECTION  1992,2000   x 2  . CHOLECYSTECTOMY N/A 12/11/2017   Procedure: LAPAROSCOPIC CHOLECYSTECTOMY;  Surgeon: Herbert Pun, MD;  Location: [facility] ORS;  Service: General;  Laterality: N/A;  . DILATION AND CURETTAGE OF UTERUS    . excision of squamous cell cancer of left forearm  2017    Family History:  Family History  Problem Relation Age of Onset  . Breast cancer  Paternal Aunt 52  . Hyperlipidemia Mother   . Hypertension Mother   . Neuropathy Mother        in feet  . Hypertension Father   . Hypertension Sister   . Hypertension Brother   . Hypertension Brother   . Pancreatic cancer Maternal Aunt 60    Social History:  Social History   Socioeconomic History  . Marital status: Married    Spouse name: Not on file  . Number of children: 2    . Years of education: Not on file  . Highest education level: Not on file  Occupational History  . Occupation: Estate agent  Social Needs  . Financial resource strain: Not on file  . Food insecurity:    Worry: Not on file    Inability: Not on file  . Transportation needs:    Medical: Not on file    Non-medical: Not on file  Tobacco Use  . Smoking status: Never Smoker  . Smokeless tobacco: Never Used  Substance and Sexual Activity  . Alcohol use: No  . Drug use: No  . Sexual activity: Yes    Partners: Male    Birth control/protection: Post-menopausal  Lifestyle  . Physical activity:    Days per week: Not on file    Minutes per session: Not on file  . Stress: Not on file  Relationships  . Social connections:    Talks on phone: Not on file    Gets together: Not on file    Attends religious service: Not on file    Active member of club or organization: Not on file    Attends meetings of clubs or organizations: Not on file    Relationship status: Not on file  . Intimate partner violence:    Fear of current or ex partner: Not on file    Emotionally abused: Not on file    Physically abused: Not on file    Forced sexual activity: Not on file  Other Topics Concern  . Not on file  Social History Narrative   Moved to near [place] in 2017/18.  [name] graduated from [employer] and now lives in [place]. Son Hold graduated from high school in 2018 and will be attending [employer].    Allergies:  No Known Allergies  Medications: Tums Ultra Physical Exam Vitals:BP 118/72   Pulse 89   Ht 5\' 3"  (1.6 m)   Wt 175 lb (79.4 kg)   LMP 10/22/2013   BMI 31.00 kg/m   General: Pleasant WF in  NAD HEENT: normocephalic, anicteric Neck: no thyroid enlargement, no palpable nodules, no cervical lymphadenopathy  Pulmonary: No increased work of breathing, CTAB Cardiovascular: RRR, without murmur  Breast: Breast symmetrical, no tenderness, no palpable nodules or masses, no  skin or nipple retraction present, no nipple discharge.  No axillary, infraclavicular or supraclavicular lymphadenopathy. Abdomen: Soft, non-tender, non-distended.  Umbilicus without lesions.  No hepatomegaly or masses palpable. No evidence of hernia. Genitourinary:  External: Normal external female genitalia.  Normal urethral meatus, normal Bartholin's and Skene's glands.    Vagina: decreased rugae, no evidence of prolapse  Cervix: Grossly normal in appearance, no bleeding, non-tender  Uterus: Anteverted, normal size, shape, and consistency, mobile, and non-tender  Adnexa: No adnexal masses, non-tender  Rectal: deferred  Lymphatic: no evidence of inguinal lymphadenopathy Extremities: no edema, erythema, or tenderness Neurologic: Grossly intact Psychiatric: mood appropriate, affect full     Assessment: 53 y.o. [id number] annual gyn exam  Plan:  1) Breast cancer screening - recommend monthly self breast exam and annual screening mammograms. Mammogram up to date.  2) Patient to schedule appointment with Dr Collene Mares for colonoscopy. Encouraged her to discuss heartburn and reflux symptoms.   3) Cervical cancer screening - Pap was done. Would like to do Pap smears every other year.   4) Osteoporosis prevention: discussed calcium and vitamin D3 requirements. Discussed the role of exercise in preventing osteoporosis. Plan for a DEXA scan at age 77  5) Routine healthcare maintenance including cholesterol and diabetes screening managed by PCP   6) RTO in 1 year and prn.  Dalia Heading, CNM

## 2018-11-18 LAB — CYTOLOGY - PAP: HPV: NOT DETECTED

## 2019-01-28 IMAGING — CR DG CHEST 2V
2 series · 2 of 2 positions shown · non-contrast
Comparison: None.

CLINICAL DATA: Cough with near syncope

EXAM:
CHEST  2 VIEW

[chest pa]
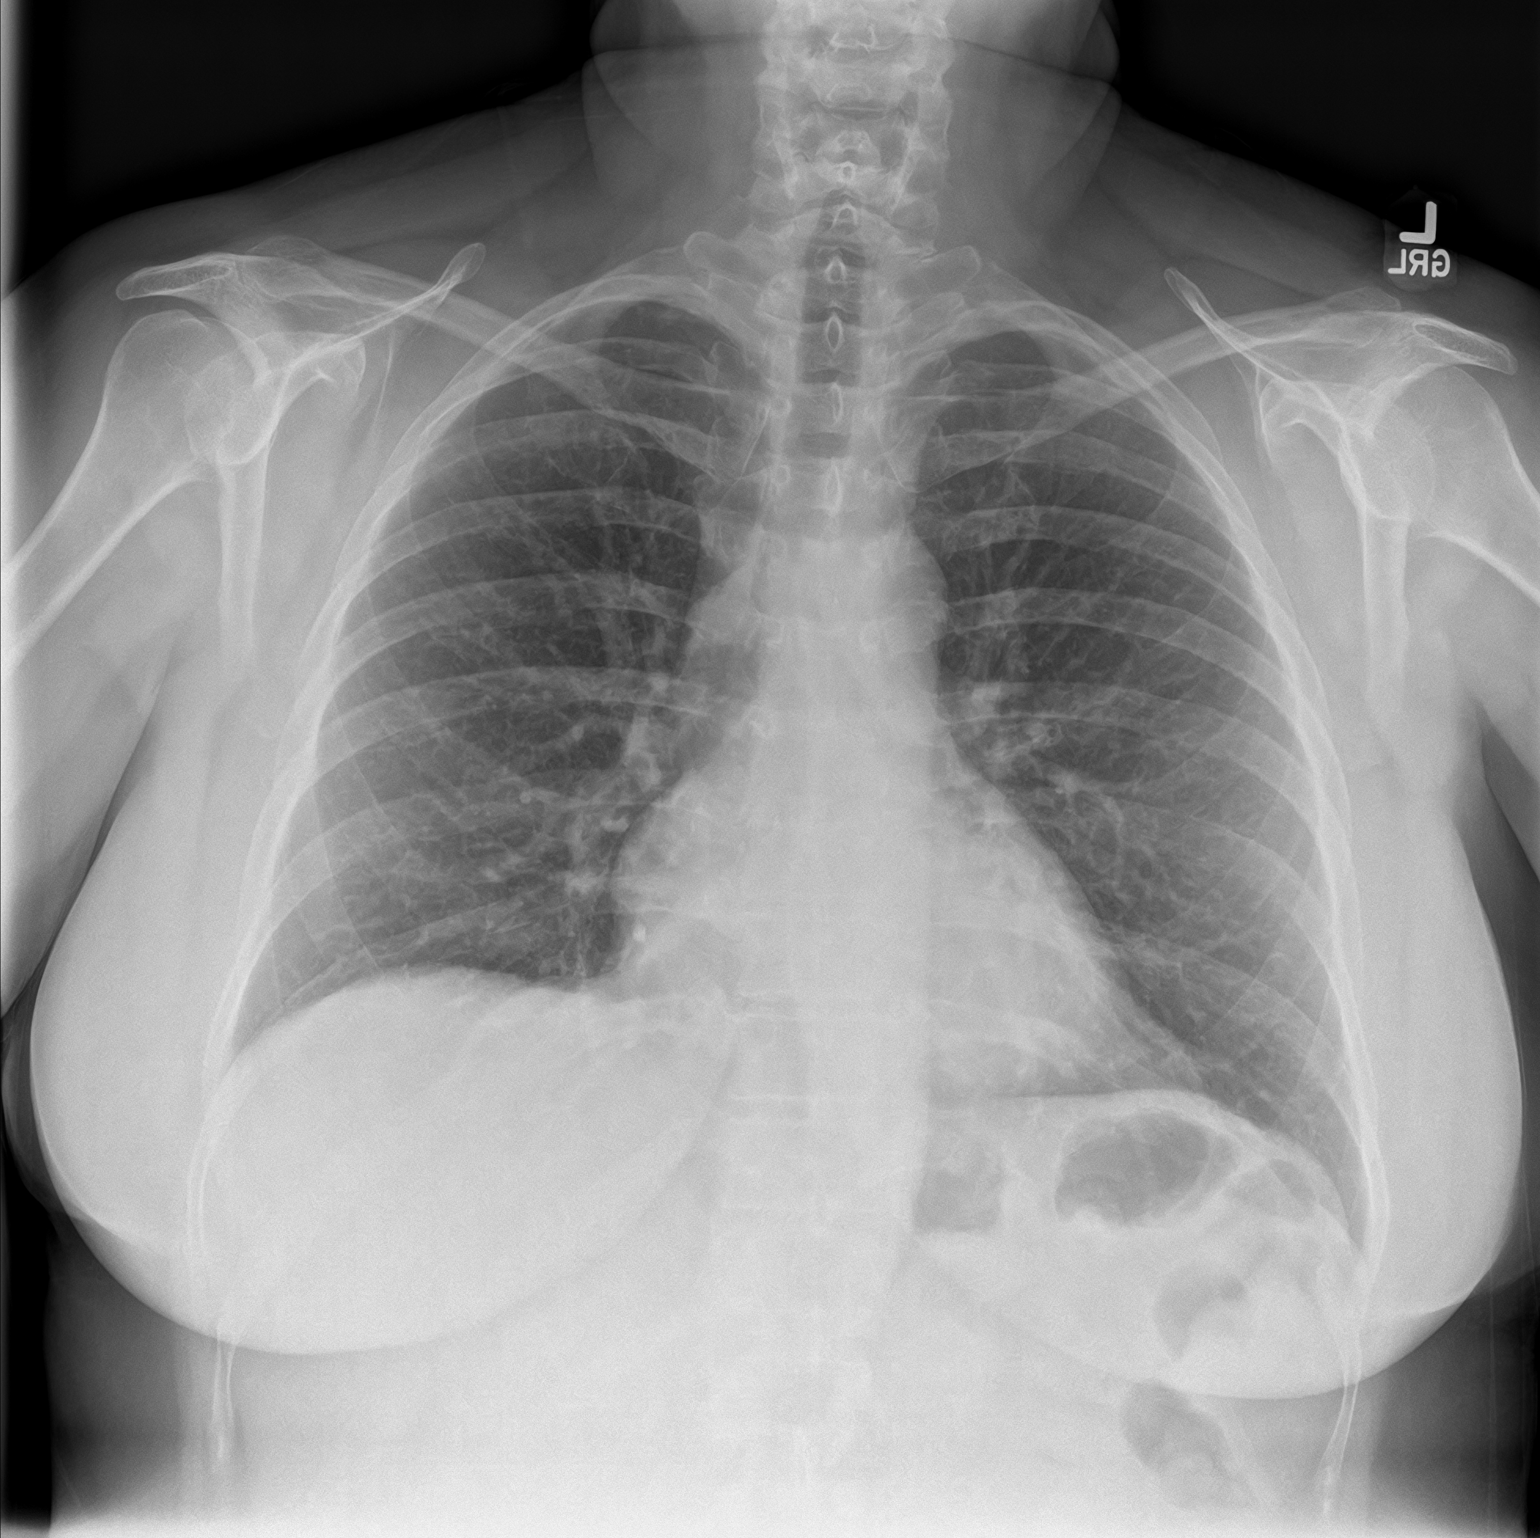

[chest lat]
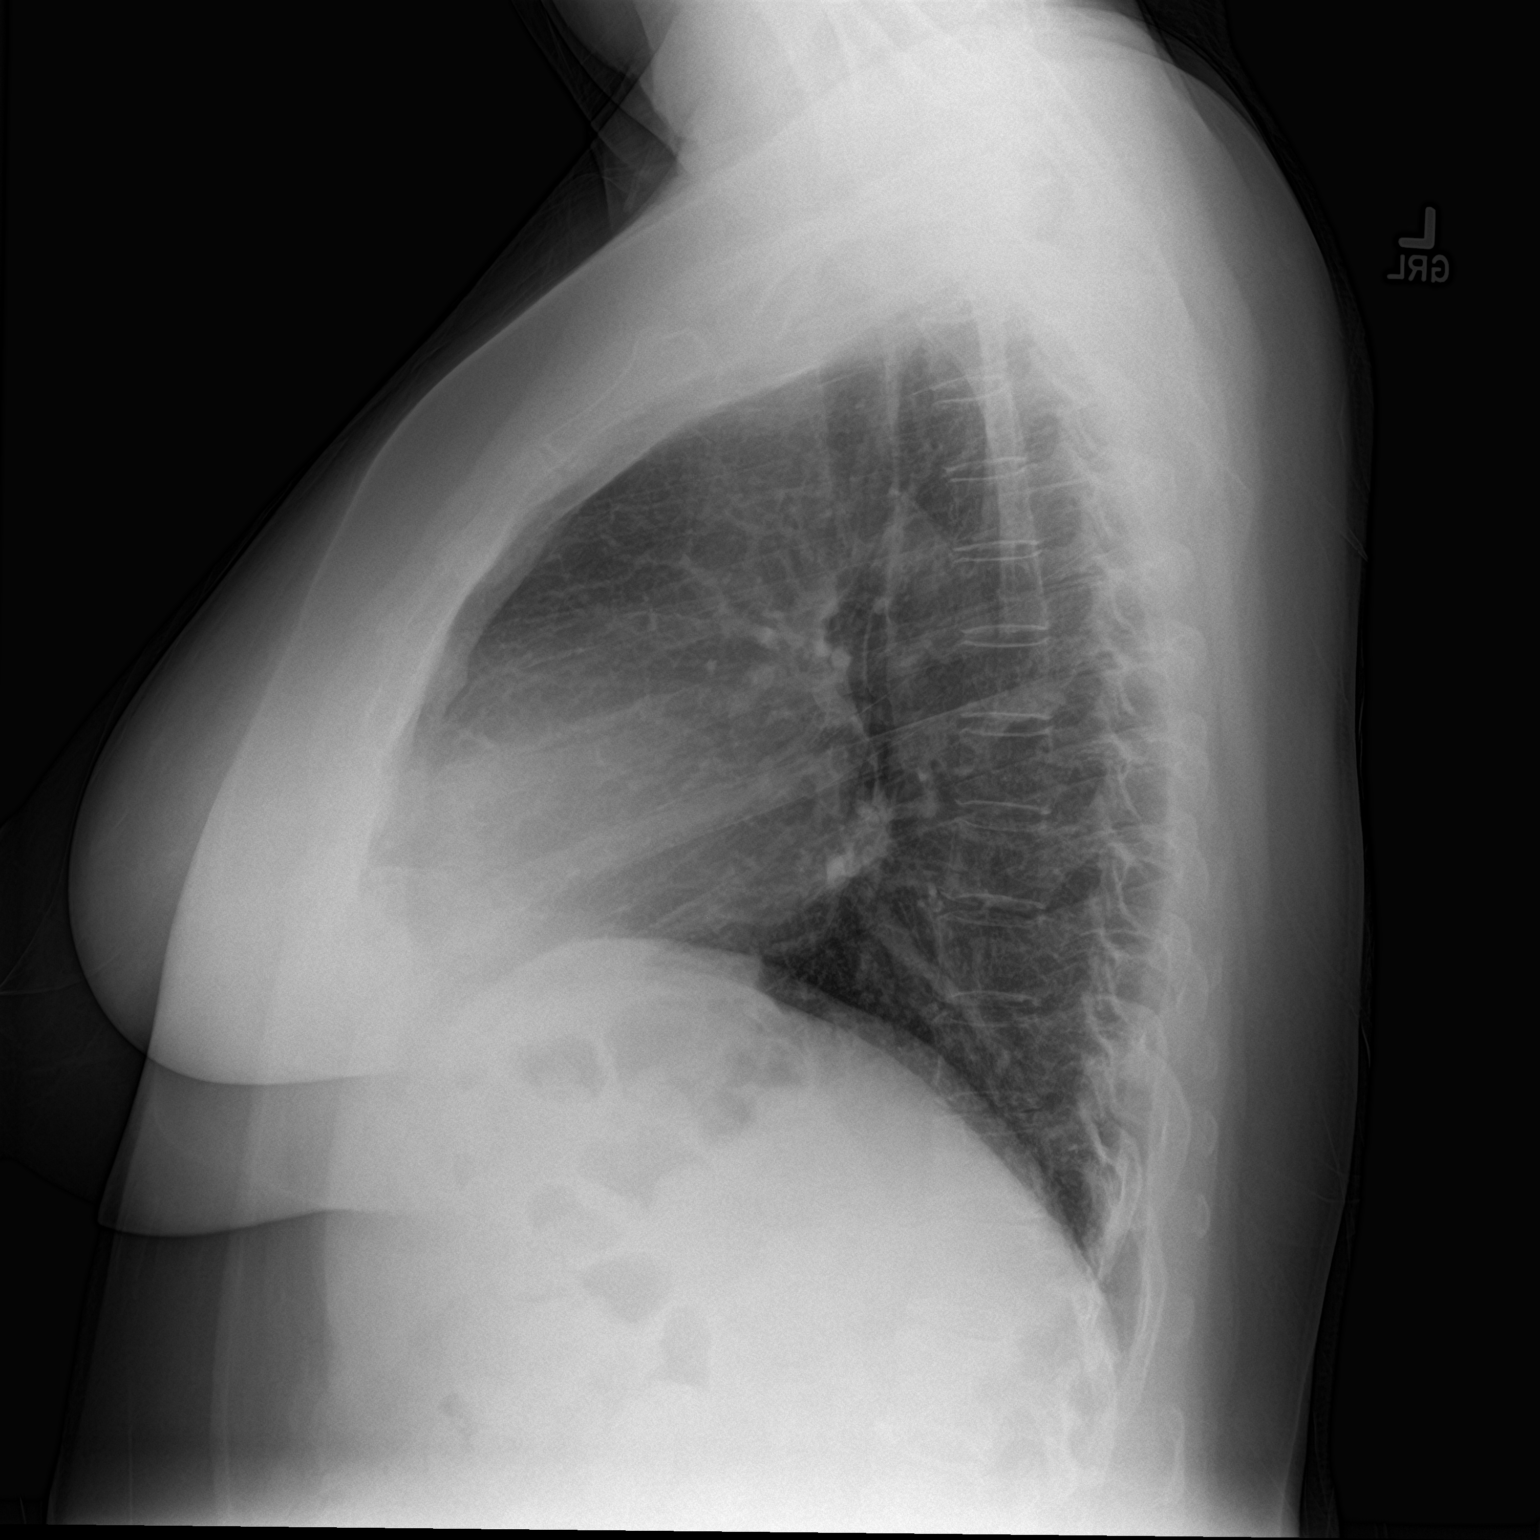

[2 of 2 positions shown; findings below may reference images not displayed]

FINDINGS: The heart size and mediastinal contours are within normal limits.
Both lungs are clear. The visualized skeletal structures are
unremarkable. Mild bronchitic changes.
IMPRESSION: No active cardiopulmonary disease.  Mild bronchitic changes.

## 2019-04-03 IMAGING — US US BREAST*L* LIMITED INC AXILLA
1 series · 13 of 13 positions shown · non-contrast
Comparison: Previous exam(s).

CLINICAL DATA: Screening recall for possible right breast asymmetry
and possible left breast mass.

EXAM:
2D DIGITAL DIAGNOSTIC BILATERAL MAMMOGRAM WITH CAD AND ADJUNCT TOMO
LEFT BREAST ULTRASOUND

[Series 1: us breast*left* limited inc axilla · 0.06mm/px · 13 of 13 slices shown]
[im 1/13]
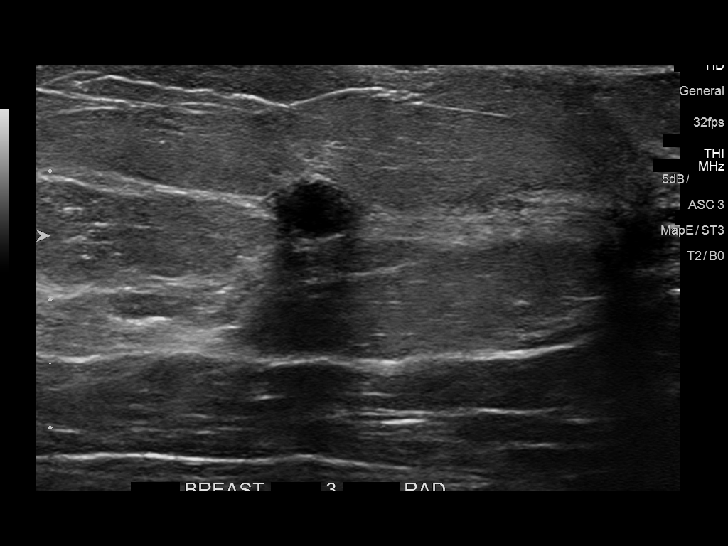
[im 2/13]
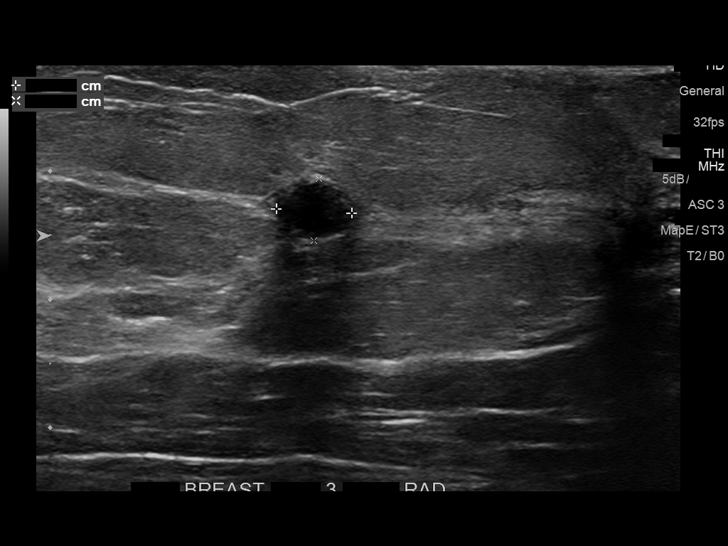
[im 3/13]
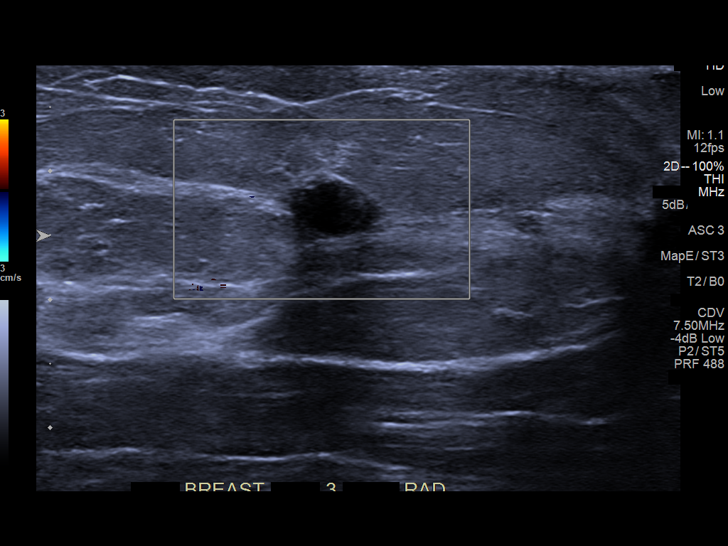
[im 4/13]
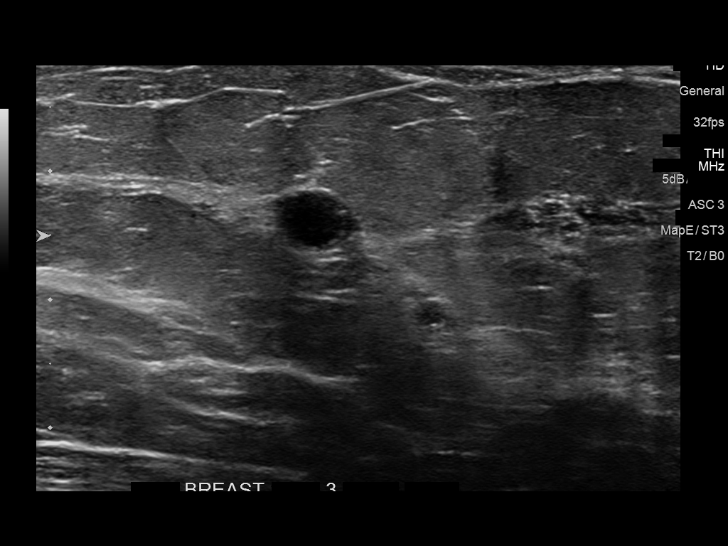
[im 5/13]
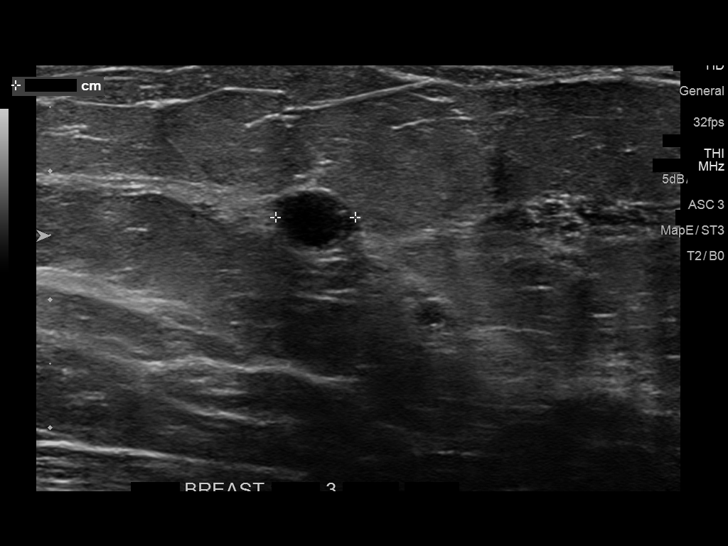
[im 6/13]
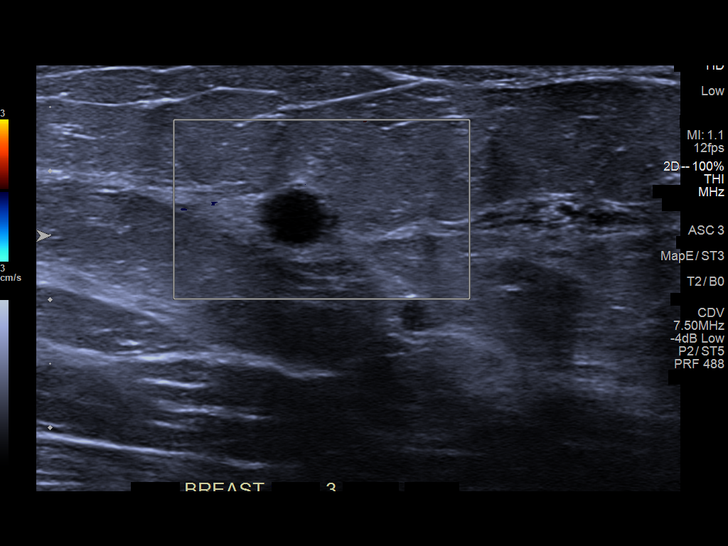
[im 7/13]
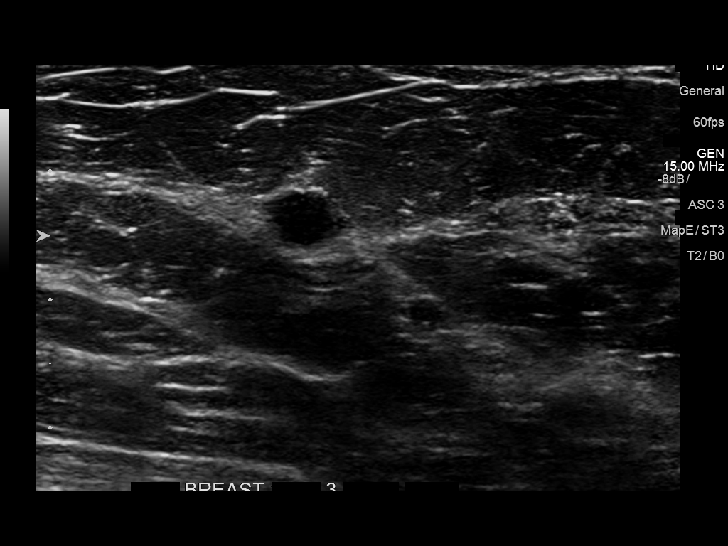
[im 8/13]
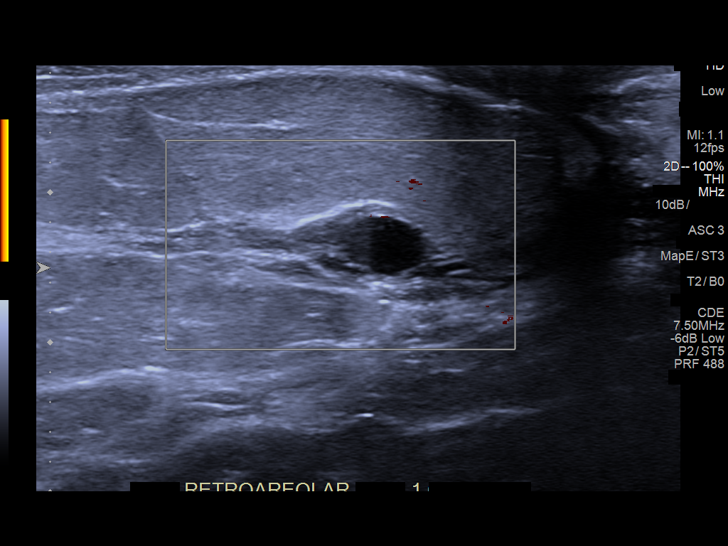
[im 9/13]
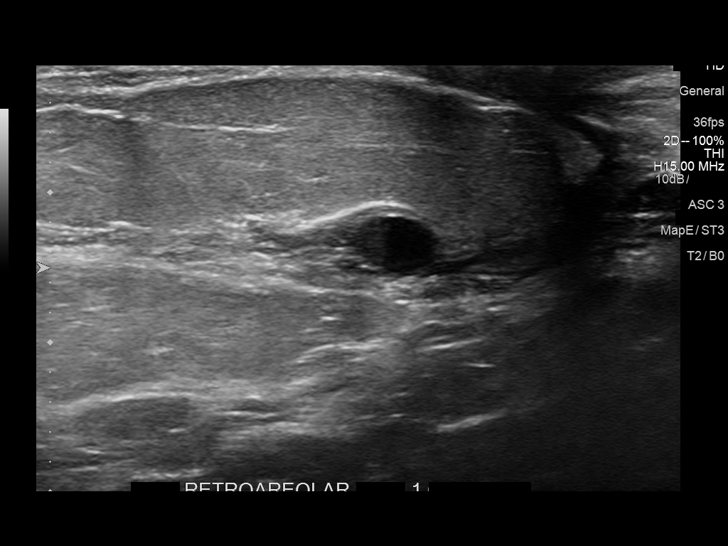
[im 10/13]
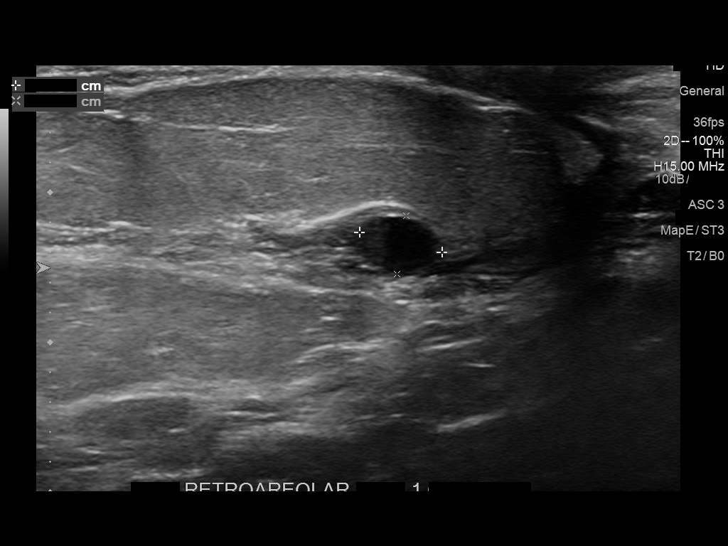
[im 11/13]
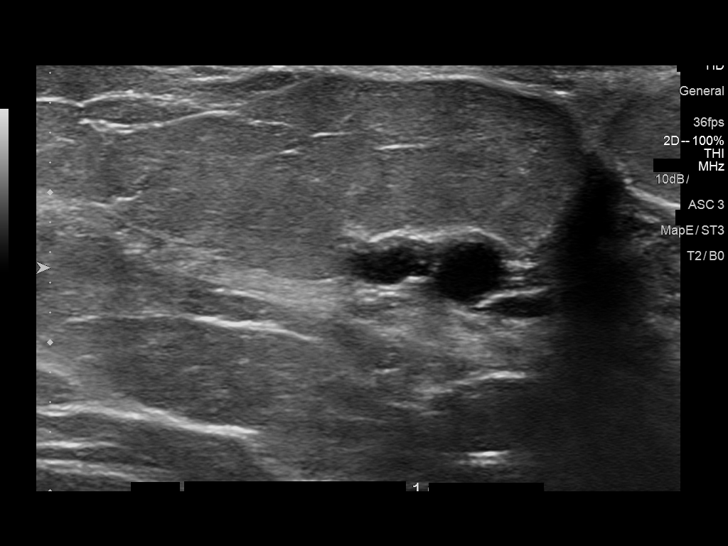
[im 12/13]
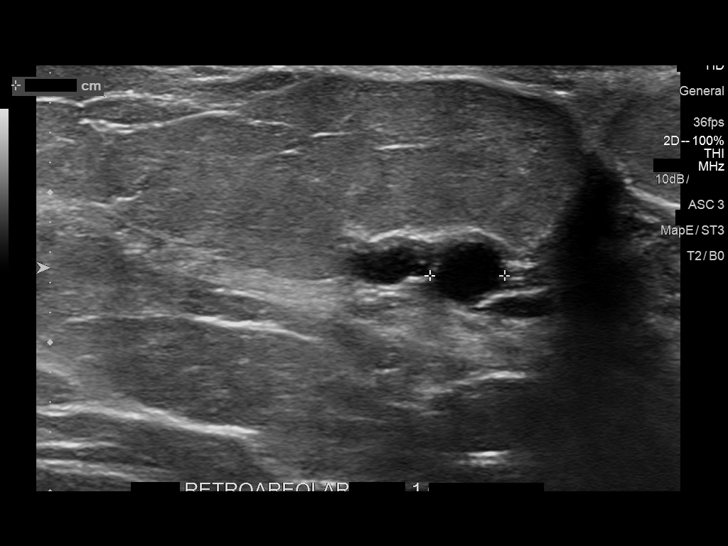
[im 13/13]
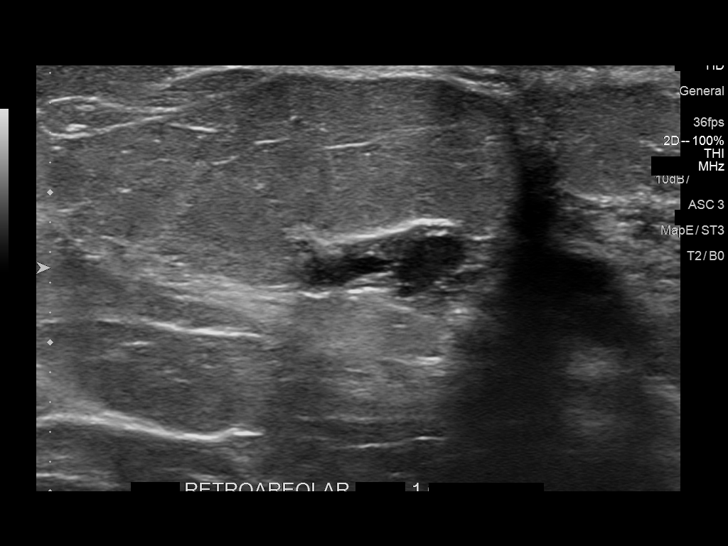

[13 of 13 positions shown; findings below may reference images not displayed]

ACR Breast Density Category b: There are scattered areas of
fibroglandular density.
FINDINGS: Cc and MLO tomograms were performed of the bilateral breasts. No
persistent suspicious masses or areas of distortion identified in
the right breast with a benign-appearing oval circumscribed mass in
the central posterior right breast unchanged on prior mammograms
dating back to at least 3446. There is an oval circumscribed mass in
the upper to slightly inner left breast measuring approximately 6
mm.

Mammographic images were processed with CAD.

Targeted ultrasound of the central and inner left was performed
demonstrating numerous mildly complicated cysts with a cyst at 10
o'clock 3 cm from nipple containing a likely partially calcified
wall measuring 0.6 x 0.5 x 0.6 cm and corresponding well with the
mass seen at mammography. An additional debris-filled cyst in the
left breast at 10 o'clock retroareolar measures 0.5 x 0.4 x 0.6 cm.
IMPRESSION: No findings of malignancy in the left breast.

RECOMMENDATION:
Screening mammogram in one year.(Code:[id number])

I have discussed the findings and recommendations with the patient.
Results were also provided in writing at the conclusion of the
visit. If applicable, a reminder letter will be sent to the patient
regarding the next appointment.

BI-RADS CATEGORY  2: Benign.

## 2019-08-15 IMAGING — US US ABDOMEN COMPLETE
1 series · 13 of 25 positions shown · non-contrast
Comparison: None.

CLINICAL DATA: 51-year-old presenting with 2 week history of nausea
and approximate 2 day history of generalized abdominal pain.

EXAM:
ABDOMEN ULTRASOUND COMPLETE

[Series 1: us abdomen complete · 0.19mm/px · 13 of 110 slices shown]
[im 1/110]
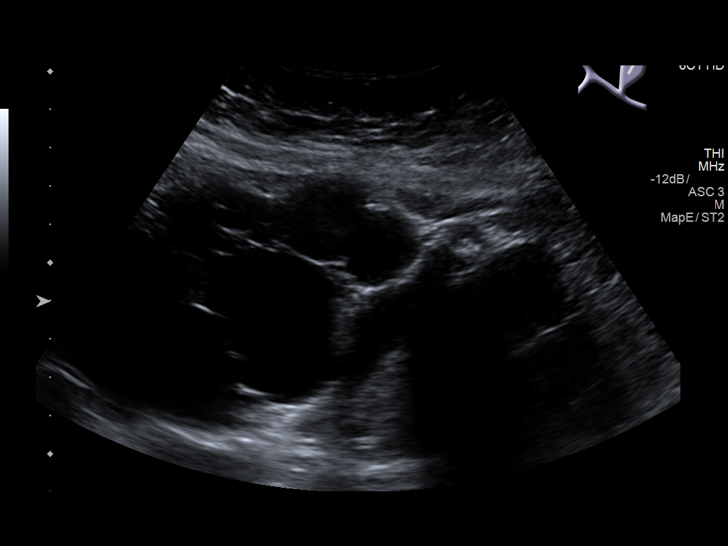
[im 10/110]
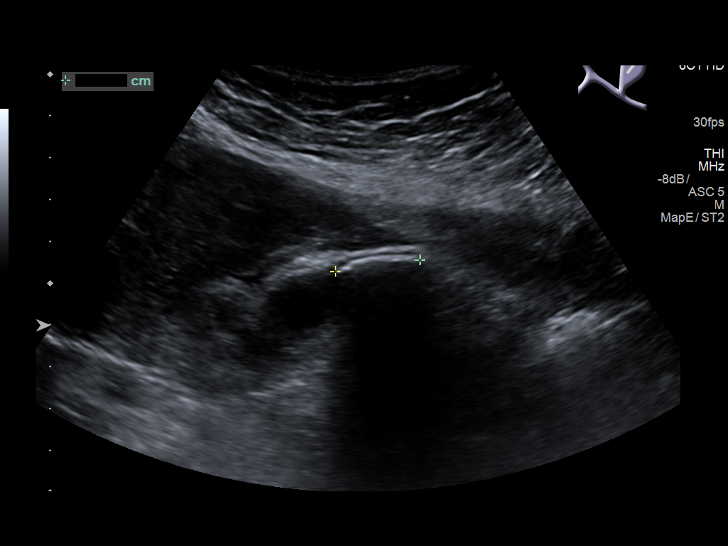
[im 19/110]
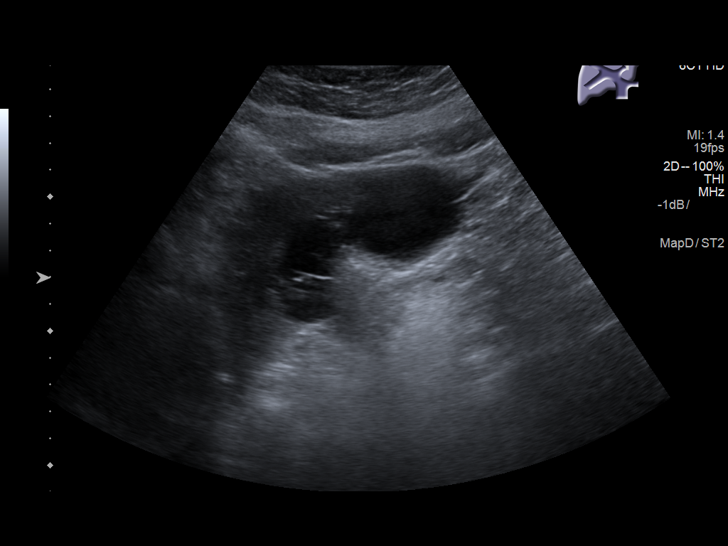
[im 28/110]
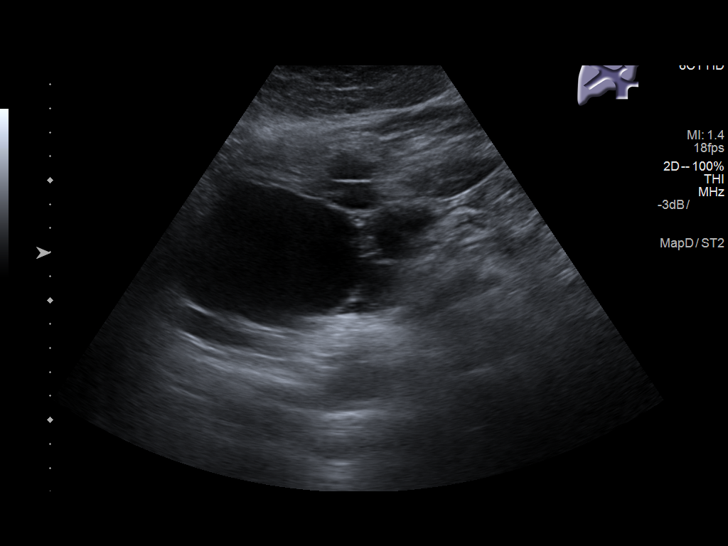
[im 37/110]
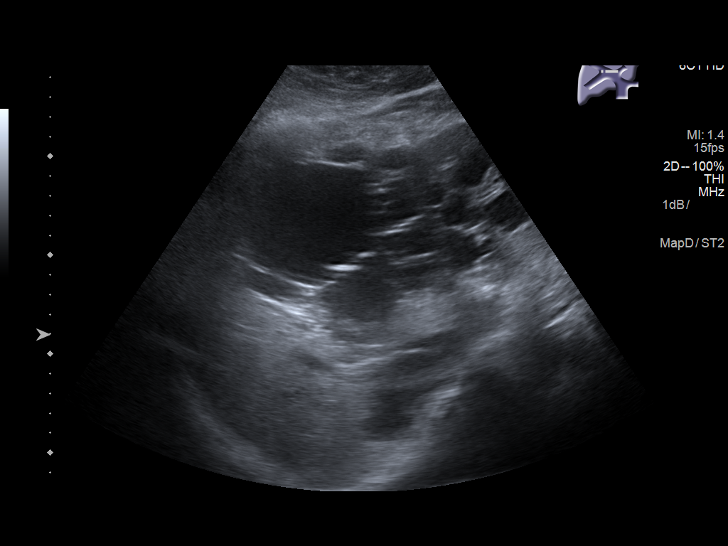
[im 46/110]
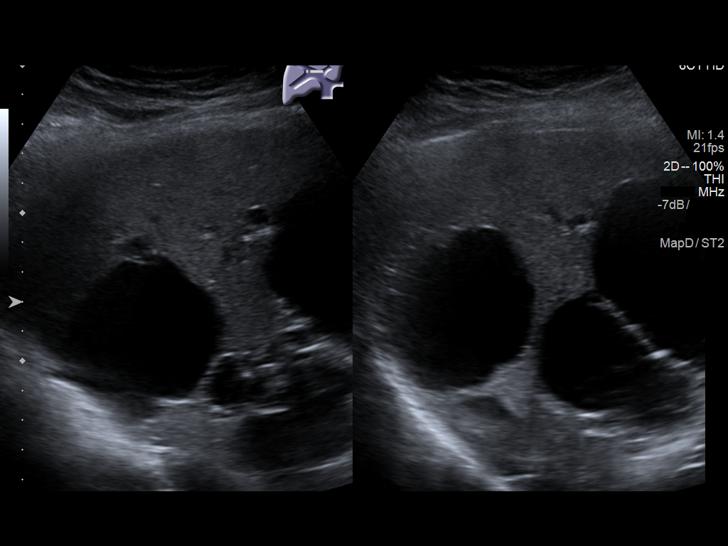
[im 55/110]
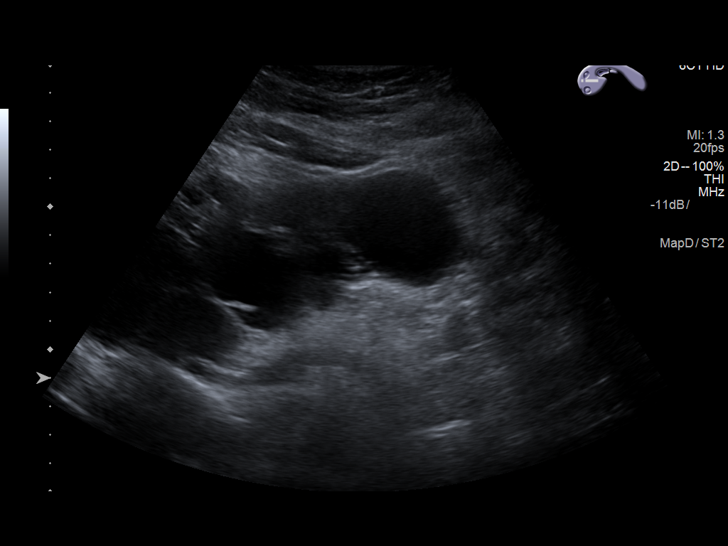
[im 64/110]
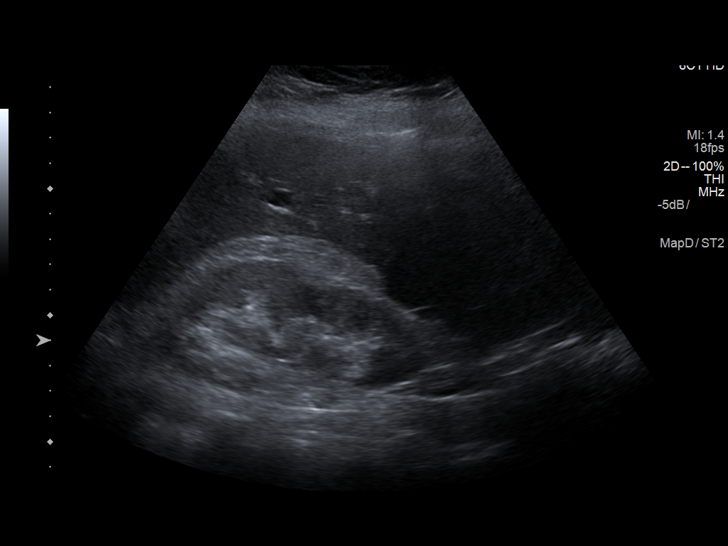
[im 73/110]
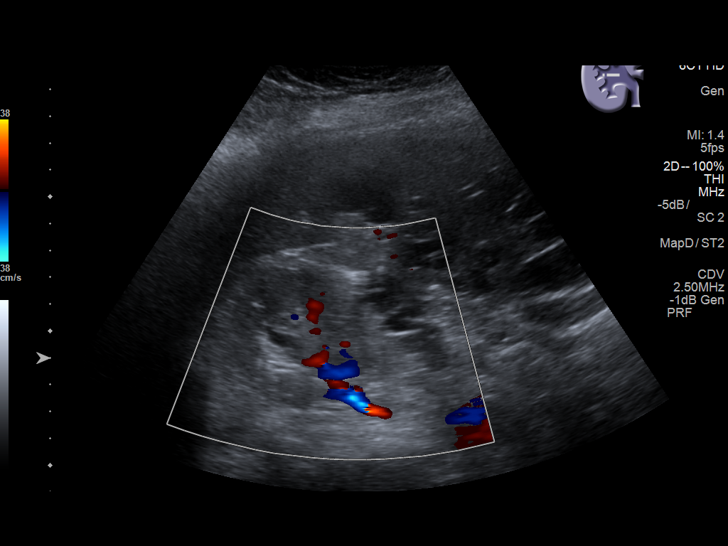
[im 82/110]
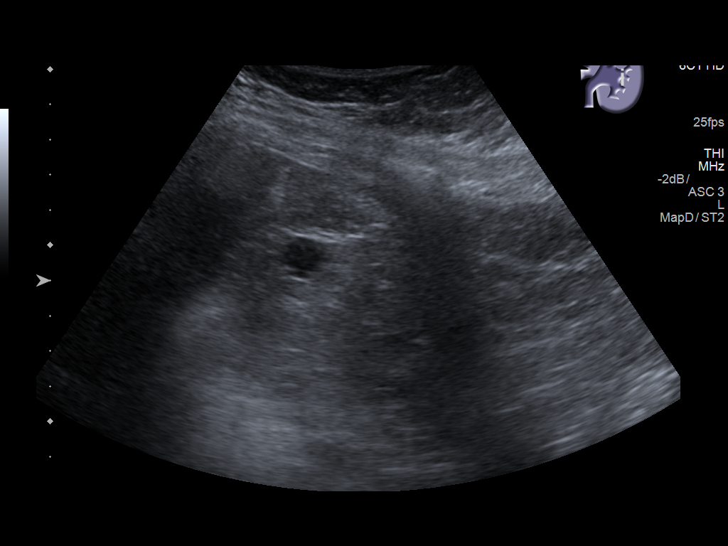
[im 91/110]
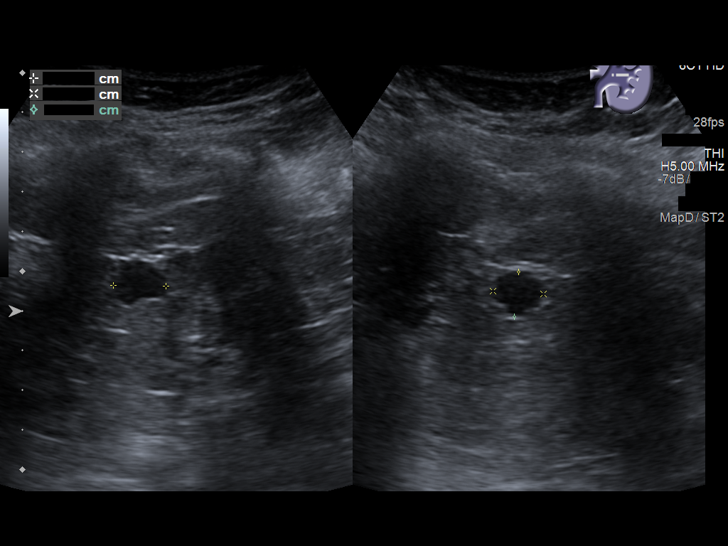
[im 100/110]
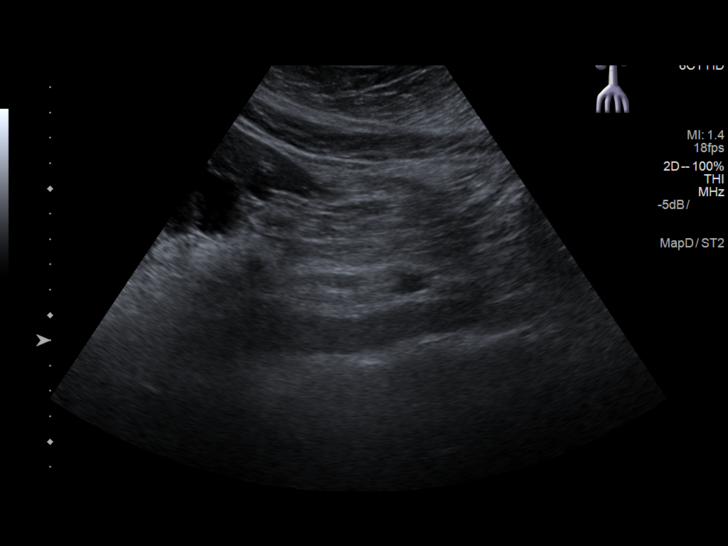
[im 110/110]
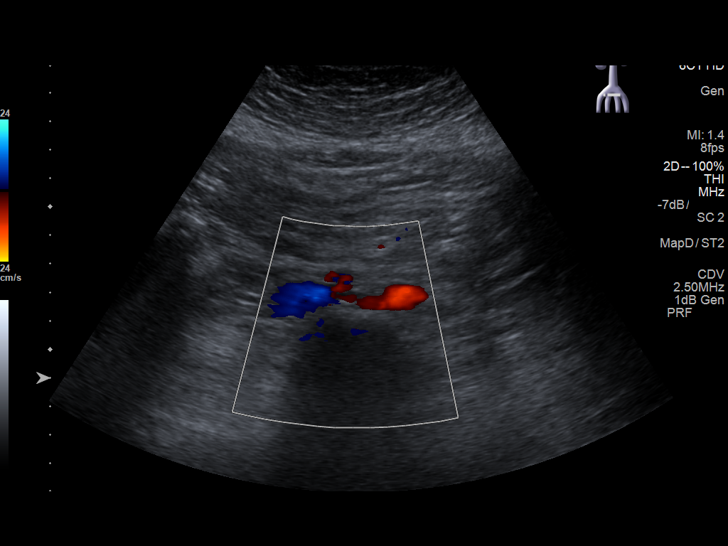

[13 of 25 positions shown; findings below may reference images not displayed]

The report of a prior abdominal ultrasound dated
12/23/2003 is correlated which described multiple hepatic cysts
measuring 2.5 cm and less.
FINDINGS: Gallbladder: Contracted, containing a gallstone measuring
approximately 2.0 cm. No gallbladder wall thickening or
pericholecystic fluid. Negative sonographic Murphy's sign according
to the ultrasound technologist.

Common bile duct: Diameter: Approximately 2 mm.

Obscured distally by duodenal bowel gas.

Liver: Numerous anechoic masses with posterior acoustic enhancement
and no internal color Doppler flow indicating cysts. The largest
cyst arising from the left lobe measures approximately 4.4 x 4.8 x
4.2 cm. The largest cyst arising from the right lobe measures
approximately 6.6 x 7.3 x 6.4 cm. No solid hepatic parenchymal
masses. Normal parenchymal echotexture. Portal vein is patent on
color Doppler imaging with normal direction of blood flow towards
the liver.

IVC: Patent.

Pancreas: While difficult to visualize in its entirety, visualized
portions normal in appearance.

Spleen: Normal size and echotexture without focal parenchymal
abnormality.

Right Kidney: Length: Approximately 11.1 cm. No hydronephrosis.
Well-preserved cortex. No shadowing calculi. Normal parenchymal
echotexture. No focal parenchymal abnormality.

Left Kidney: Length: Approximately 10.5 cm. No hydronephrosis.
Well-preserved cortex. No shadowing calculi. Adjacent anechoic
masses demonstrating posterior acoustic enhancement and no internal
color Doppler flow indicating cysts arising from the mid kidney, the
larger measuring approximately 1.3 cm and the smaller measuring
approximately 0.9 cm. No solid parenchymal masses.

Abdominal aorta: Normal in caliber throughout its visualized course
in the abdomen without evidence of significant atherosclerosis.
Maximum diameter 2.4 cm.

Other findings: None.
IMPRESSION: 1. Contracted gallbladder containing an approximate 2.0 cm
gallstone. No sonographic evidence of acute cholecystitis.
2. Numerous benign hepatic cysts, increased in size since the report
of the prior ultrasound from 7993. The largest cysts in both lobes
are measured above. No solid hepatic parenchymal masses.
3. Adjacent benign cysts arising from the mid left kidney.

## 2019-10-28 ENCOUNTER — Other Ambulatory Visit: Payer: Self-pay | Admitting: Certified Nurse Midwife

## 2019-10-28 DIAGNOSIS — Z1231 Encounter for screening mammogram for malignant neoplasm of breast: Secondary | ICD-10-CM

## 2019-10-30 ENCOUNTER — Ambulatory Visit
Admission: RE | Admit: 2019-10-30 | Discharge: 2019-10-30 | Disposition: A | Discharge status: Home / Self Care | Payer: 59 | Source: Ambulatory Visit | Attending: Certified Nurse Midwife | Admitting: Certified Nurse Midwife

## 2019-10-30 DIAGNOSIS — Z1231 Encounter for screening mammogram for malignant neoplasm of breast: Secondary | ICD-10-CM | POA: Insufficient documentation

## 2019-11-15 NOTE — Progress Notes (Deleted)
Gynecology Annual Exam  PCP: Marinda Elk, MD  Chief Complaint:  No chief complaint on file.   History of Present Illness:Jessica Harmon is a 54 year old Caucasian/White female, G4 P2022, who presents for her annual exam. She is having no significant GYN problems.  Her menses are absent since 09/2012 and she is postmenopausal. She does not have difficulty sleeping, hot flashes, and vaginal dryness.  She has had no spotting.   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 She had a right breast bx in 12/13 which was benign..  Since her last annual GYN exam dated 06/12/2017, she has had a cholecystectomy which has decreased her heartburn and reflux symptoms. She still does take Tums ultra 3-4x/week with relief.  She is sexually active.  Her most recent pap smear was obtained 06/12/2017 and was negative  Her most recent mammogram obtained on 09/25/2018 which was negative.  There is a positive history of breast cancer in her paternal aunt. Genetic testing has not been done.  There is no family history of ovarian cancer.  The patient does do occ monthly self breast exams.  Has not had a colonoscopy. Plans on making appointment with Dr Collene Mares in [place] for colonoscopy. The patient does not smoke.  The patient rarely drinks alcohol.  The patient does not use illegal drugs.  The patient exercises by biking on a recumbent bike 2 miles/3-4x/week. The patient does not get adequate calcium in her diet.  She had a recent cholesterol screen in 2017 by PCP Boykin Reaper at [facility] that was normal.    The patient denies current symptoms of depression.    Review of Systems: Review of Systems  Constitutional: Negative for chills, fever and weight loss.  HENT: Negative for congestion, sinus pain and sore throat.   Eyes: Negative for blurred vision and pain.  Respiratory: Negative for hemoptysis, shortness  of breath and wheezing.   Cardiovascular: Negative for chest pain, palpitations and leg swelling.  Gastrointestinal: Positive for heartburn. Negative for abdominal pain, blood in stool, diarrhea, nausea and vomiting.  Genitourinary: Negative for dysuria, frequency, hematuria and urgency.  Musculoskeletal: Negative for back pain, joint pain and myalgias.  Skin: Negative for itching and rash.  Neurological: Negative for dizziness, tingling and headaches.  Endo/Heme/Allergies: Negative for environmental allergies and polydipsia. Does not bruise/bleed easily.       Negative for hirsutism   Psychiatric/Behavioral: Negative for depression. The patient is not nervous/anxious and does not have insomnia.     Past Medical History:  Past Medical History:  Diagnosis Date  . GERD (gastroesophageal reflux disease)    especially since gallbladder issues  . Hypertension   . Lump or mass in breast 2013   benign. left breast  . Squamous cell cancer of skin of forearm, left 2017    Past Surgical History:  Past Surgical History:  Procedure Laterality Date  . BREAST BIOPSY Right 2013   needle  . CESAREAN SECTION  1992,2000   x 2  . CHOLECYSTECTOMY N/A 12/11/2017   Procedure: LAPAROSCOPIC CHOLECYSTECTOMY;  Surgeon: Herbert Pun, MD;  Location: [facility] ORS;  Service: General;  Laterality: N/A;  . DILATION AND CURETTAGE OF UTERUS    . excision of squamous cell cancer of left forearm  2017    Family History:  Family History  Problem Relation Age of Onset  . Breast cancer Paternal Aunt 67  . Hyperlipidemia  Mother   . Hypertension Mother   . Neuropathy Mother        in feet  . Hypertension Father   . Hypertension Sister   . Hypertension Brother   . Hypertension Brother   . Pancreatic cancer Maternal Aunt 60    Social History:  Social History   Socioeconomic History  . Marital status: Married    Spouse name: Not on file  . Number of children: 2  . Years of education: Not on file   . Highest education level: Not on file  Occupational History  . Occupation: Estate agent  Tobacco Use  . Smoking status: Never Smoker  . Smokeless tobacco: Never Used  Substance and Sexual Activity  . Alcohol use: No  . Drug use: No  . Sexual activity: Yes    Partners: Male    Birth control/protection: Post-menopausal  Other Topics Concern  . Not on file  Social History Narrative   Moved to near [place] in 2017/18.  [name] graduated from [employer] and now lives in [place]. Son Hold graduated from high school in 2018 and will be attending [employer].   Social Determinants of Health   Financial Resource Strain:   . Difficulty of Paying Living Expenses: Not on file  Food Insecurity:   . Worried About Charity fundraiser in the Last Year: Not on file  . Ran Out of Food in the Last Year: Not on file  Transportation Needs:   . Lack of Transportation (Medical): Not on file  . Lack of Transportation (Non-Medical): Not on file  Physical Activity:   . Days of Exercise per Week: Not on file  . Minutes of Exercise per Session: Not on file  Stress:   . Feeling of Stress : Not on file  Social Connections:   . Frequency of Communication with Friends and Family: Not on file  . Frequency of Social Gatherings with Friends and Family: Not on file  . Attends Religious Services: Not on file  . Active Member of Clubs or Organizations: Not on file  . Attends Archivist Meetings: Not on file  . Marital Status: Not on file  Intimate Partner Violence:   . Fear of Current or Ex-Partner: Not on file  . Emotionally Abused: Not on file  . Physically Abused: Not on file  . Sexually Abused: Not on file    Allergies:  No Known Allergies  Medications: Tums Ultra Physical Exam Vitals:LMP 10/22/2013   General: Pleasant WF in  NAD HEENT: normocephalic, anicteric Neck: no thyroid enlargement, no palpable nodules, no cervical lymphadenopathy  Pulmonary: No increased work  of breathing, CTAB Cardiovascular: RRR, without murmur  Breast: Breast symmetrical, no tenderness, no palpable nodules or masses, no skin or nipple retraction present, no nipple discharge.  No axillary, infraclavicular or supraclavicular lymphadenopathy. Abdomen: Soft, non-tender, non-distended.  Umbilicus without lesions.  No hepatomegaly or masses palpable. No evidence of hernia. Genitourinary:  External: Normal external female genitalia.  Normal urethral meatus, normal Bartholin's and Skene's glands.    Vagina: decreased rugae, no evidence of prolapse  Cervix: Grossly normal in appearance, no bleeding, non-tender  Uterus: Anteverted, normal size, shape, and consistency, mobile, and non-tender  Adnexa: No adnexal masses, non-tender  Rectal: deferred  Lymphatic: no evidence of inguinal lymphadenopathy Extremities: no edema, erythema, or tenderness Neurologic: Grossly intact Psychiatric: mood appropriate, affect full     Assessment: 54 y.o. [id number] annual gyn exam  Plan:   1) Breast cancer  screening - recommend monthly self breast exam and annual screening mammograms. Mammogram up to date.  2) Patient to schedule appointment with Dr Collene Mares for colonoscopy. Encouraged her to discuss heartburn and reflux symptoms.   3) Cervical cancer screening - Pap was done. Would like to do Pap smears every other year.   4) Osteoporosis prevention: discussed calcium and vitamin D3 requirements. Discussed the role of exercise in preventing osteoporosis. Plan for a DEXA scan at age 26  5) Routine healthcare maintenance including cholesterol and diabetes screening managed by PCP   6) RTO in 1 year and prn.  Dalia Heading, CNM

## 2019-11-16 ENCOUNTER — Ambulatory Visit: Payer: 59 | Admitting: Certified Nurse Midwife

## 2020-02-14 NOTE — Progress Notes (Signed)
Gynecology Annual Exam  PCP: Marinda Elk, MD  Chief Complaint:  Chief Complaint  Patient presents with  . Gynecologic Exam    no complaints    History of Present Illness:Jessica Harmon is a 54 year old Caucasian/White female, G4 P2022, who presents for her annual exam. She is having no significant GYN problems.  Her menses are absent since 09/2012 and she is postmenopausal. She does not have difficulty sleeping, hot flashes, and vaginal dryness.  She has had no spotting.   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 She had a right breast bx in 12/13 which was benign..  Since her last annual GYN exam dated 11/12/2018 she has had no significant changes in her health. She has had her second Covid vaccine.  She still does take Tums ultra 3-4x/week with relief.  She is sexually active.  Her most recent pap smear was obtained 11/12/2018 and was NIL/negative HRHPV  Her most recent mammogram obtained on 10/30/2019 which was negative.  There is a positive history of breast cancer in her paternal aunt. Genetic testing is not indicated There is no family history of ovarian cancer.  The patient does do occ monthly self breast exams.  Has not had a colonoscopy. She was referred to GI by her PCP last month, but has not heard from GI.  The patient does not smoke.  The patient rarely drinks alcohol.  The patient does not use illegal drugs.  The patient exercises by biking on a recumbent bike 81miles/most days of the week. The patient does not get adequate calcium in her diet.  She had a recent cholesterol screen in 2021 by PCP Boykin Reaper at [facility] that was normal.    The patient denies current symptoms of depression.    Review of Systems: Review of Systems  Constitutional: Negative for chills, fever and weight loss.  HENT: Negative for congestion, sinus pain and sore throat.   Eyes: Negative for  blurred vision and pain.  Respiratory: Negative for hemoptysis, shortness of breath and wheezing.   Cardiovascular: Negative for chest pain, palpitations and leg swelling.  Gastrointestinal: Positive for heartburn. Negative for abdominal pain, blood in stool, diarrhea, nausea and vomiting.  Genitourinary: Negative for dysuria, frequency, hematuria and urgency.  Musculoskeletal: Negative for back pain, joint pain and myalgias.  Skin: Negative for itching and rash.  Neurological: Negative for dizziness, tingling and headaches.  Endo/Heme/Allergies: Negative for environmental allergies and polydipsia. Does not bruise/bleed easily.       Negative for hirsutism   Psychiatric/Behavioral: Negative for depression. The patient is not nervous/anxious and does not have insomnia.     Past Medical History:  Past Medical History:  Diagnosis Date  . GERD (gastroesophageal reflux disease)    especially since gallbladder issues  . Hypertension   . Lump or mass in breast 2013   benign. left breast  . Squamous cell cancer of skin of forearm, left 2017    Past Surgical History:  Past Surgical History:  Procedure Laterality Date  . BREAST BIOPSY Right 2013   needle  . CESAREAN SECTION  1992,2000   x 2  . CHOLECYSTECTOMY N/A 12/11/2017   Procedure: LAPAROSCOPIC CHOLECYSTECTOMY;  Surgeon: Herbert Pun, MD;  Location: [facility] ORS;  Service: General;  Laterality: N/A;  . DILATION AND CURETTAGE OF UTERUS    . excision of squamous cell cancer of left forearm  2017  Family History:  Family History  Problem Relation Age of Onset  . Breast cancer Paternal Aunt 74  . Hyperlipidemia Mother   . Hypertension Mother   . Neuropathy Mother        in feet  . Hypertension Father   . Hypertension Sister   . Hypertension Brother   . Hypertension Brother   . Pancreatic cancer Maternal Aunt 60    Social History:  Social History   Socioeconomic History  . Marital status: Married    Spouse name:  Not on file  . Number of children: 2  . Years of education: Not on file  . Highest education level: Not on file  Occupational History  . Occupation: Estate agent  Tobacco Use  . Smoking status: Never Smoker  . Smokeless tobacco: Never Used  Substance and Sexual Activity  . Alcohol use: No  . Drug use: No  . Sexual activity: Yes    Partners: Male    Birth control/protection: Post-menopausal  Other Topics Concern  . Not on file  Social History Narrative   Moved to near [place] course in 2017/18.  [name] graduated from [employer] and now lives in [place]. Son Hold graduated from high school in 2018 and is attending [employer]   Social Determinants of Health   Financial Resource Strain:   . Difficulty of Paying Living Expenses:   Food Insecurity:   . Worried About Charity fundraiser in the Last Year:   . Arboriculturist in the Last Year:   Transportation Needs:   . Film/video editor (Medical):   Marland Kitchen Lack of Transportation (Non-Medical):   Physical Activity:   . Days of Exercise per Week:   . Minutes of Exercise per Session:   Stress:   . Feeling of Stress :   Social Connections:   . Frequency of Communication with Friends and Family:   . Frequency of Social Gatherings with Friends and Family:   . Attends Religious Services:   . Active Member of Clubs or Organizations:   . Attends Archivist Meetings:   Marland Kitchen Marital Status:   Intimate Partner Violence:   . Fear of Current or Ex-Partner:   . Emotionally Abused:   Marland Kitchen Physically Abused:   . Sexually Abused:     Allergies:  No Known Allergies  Medications: Tums Ultra Physical Exam Vitals:BP 130/78 (BP Location: Right Arm, Patient Position: Sitting, Cuff Size: Normal)   Pulse 72   Ht 5\' 3"  (1.6 m)   Wt 175 lb (79.4 kg)   LMP 10/22/2013   BMI 31.00 kg/m   General: Pleasant WF in  NAD HEENT: normocephalic, anicteric Neck: no thyroid enlargement, no palpable nodules, no cervical lymphadenopathy    Pulmonary: No increased work of breathing, CTAB Cardiovascular: RRR, without murmur  Breast: Breast symmetrical, no tenderness, no palpable nodules or masses, no skin or nipple retraction present, no nipple discharge.  No axillary, infraclavicular or supraclavicular lymphadenopathy. Abdomen: Soft, non-tender, non-distended.  Umbilicus without lesions.  No hepatomegaly or masses palpable. No evidence of hernia. Genitourinary:  External: Atrophic changes. Small labia minora. Normal urethral meatus, normal Bartholin's and Skene's glands.    Vagina: Decreased rugae, no evidence of prolapse  Cervix: Deviated to the left, Grossly normal in appearance, no bleeding, non-tender  Uterus: Anteverted, normal size, shape, and consistency, mobile, and non-tender  Adnexa: No adnexal masses, non-tender  Rectal: deferred  Lymphatic: no evidence of inguinal lymphadenopathy Extremities: no edema, erythema, or tenderness  Neurologic: Grossly intact Psychiatric: mood appropriate, affect full     Assessment: 54 y.o. [id number] annual postmenopausal gyn exam  Plan:   1) Breast cancer screening - recommend monthly self breast exam and annual screening mammograms. Mammogram up to date.  2) Colon cancer screening: will refer to [facility] for colonoscopy. PCP referred to [facility], but she has not heard back (they are down to one physician)  3) Cervical cancer screening - Pap was not done. Would like to do Pap smears every other year.   4) Osteoporosis prevention: discussed calcium and vitamin D3 requirements. Discussed the role of exercise in preventing osteoporosis. Plan for a DEXA scan at age 27  5) Routine healthcare maintenance including cholesterol and diabetes screening managed by PCP   6) RTO in 1 year and prn.  Dalia Heading, CNM

## 2020-02-15 ENCOUNTER — Encounter: Payer: Self-pay | Admitting: Certified Nurse Midwife

## 2020-02-15 ENCOUNTER — Other Ambulatory Visit: Payer: Self-pay

## 2020-02-15 ENCOUNTER — Ambulatory Visit (INDEPENDENT_AMBULATORY_CARE_PROVIDER_SITE_OTHER): Payer: 59 | Admitting: Certified Nurse Midwife

## 2020-02-15 VITALS — BP 130/78 | HR 72 | Ht 63.0 in | Wt 175.0 lb

## 2020-02-15 DIAGNOSIS — Z1211 Encounter for screening for malignant neoplasm of colon: Secondary | ICD-10-CM | POA: Diagnosis not present

## 2020-02-15 DIAGNOSIS — Z01419 Encounter for gynecological examination (general) (routine) without abnormal findings: Secondary | ICD-10-CM

## 2020-02-17 ENCOUNTER — Encounter: Payer: Self-pay | Admitting: Obstetrics and Gynecology

## 2020-02-23 DIAGNOSIS — D509 Iron deficiency anemia, unspecified: Secondary | ICD-10-CM | POA: Insufficient documentation

## 2020-02-24 ENCOUNTER — Telehealth: Payer: 59

## 2020-02-24 ENCOUNTER — Telehealth: Payer: Self-pay

## 2020-02-24 NOTE — Telephone Encounter (Signed)
Patient scheduled nurse visit to schedule her colonoscopy at 8:30.  LVM at 8:30am and 8:40am in attempts to schedule.  Asked patient to call office back to reschedule this visit.  Thanks,  [place], [place]

## 2020-03-02 ENCOUNTER — Telehealth (INDEPENDENT_AMBULATORY_CARE_PROVIDER_SITE_OTHER): Payer: Self-pay | Admitting: Gastroenterology

## 2020-03-02 DIAGNOSIS — Z1211 Encounter for screening for malignant neoplasm of colon: Secondary | ICD-10-CM

## 2020-03-02 NOTE — Progress Notes (Signed)
Gastroenterology Pre-Procedure Review  Request Date: Friday 04/08/20 Requesting Physician: Dr. Vicente Males  PATIENT REVIEW QUESTIONS: The patient responded to the following health history questions as indicated:    1. Are you having any GI issues? no 2. Do you have a personal history of Polyps? no 3. Do you have a family history of Colon Cancer or Polyps? no 4. Diabetes Mellitus? no 5. Joint replacements in the past 12 months?no 6. Major health problems in the past 3 months?no 7. Any artificial heart valves, MVP, or defibrillator?no    MEDICATIONS & ALLERGIES:    Patient reports the following regarding taking any anticoagulation/antiplatelet therapy:   Plavix, Coumadin, Eliquis, Xarelto, Lovenox, Pradaxa, Brilinta, or Effient? no Aspirin? no  Patient confirms/reports the following medications:  Current Outpatient Medications  Medication Sig Dispense Refill  . cetirizine (ZYRTEC) 10 MG tablet Take by mouth.    Marland Kitchen ibuprofen (ADVIL,MOTRIN) 200 MG tablet Take 600 mg by mouth every 8 (eight) hours as needed for headache or mild pain.    Marland Kitchen lisinopril (PRINIVIL,ZESTRIL) 10 MG tablet Take 10 mg by mouth daily. In the morning     No current facility-administered medications for this visit.    Patient confirms/reports the following allergies:  No Known Allergies  No orders of the defined types were placed in this encounter.   AUTHORIZATION INFORMATION Primary Insurance: 1D#: Group #:  Secondary Insurance: 1D#: Group #:  SCHEDULE INFORMATION: Date: Friday 04/08/20 Time: Location:[facility]

## 2020-04-06 ENCOUNTER — Other Ambulatory Visit: Payer: Self-pay

## 2020-04-06 ENCOUNTER — Other Ambulatory Visit
Admission: RE | Admit: 2020-04-06 | Discharge: 2020-04-06 | Disposition: A | Discharge status: Home / Self Care | Payer: 59 | Source: Ambulatory Visit | Attending: Gastroenterology | Admitting: Gastroenterology

## 2020-04-06 DIAGNOSIS — Z20822 Contact with and (suspected) exposure to covid-19: Secondary | ICD-10-CM | POA: Diagnosis not present

## 2020-04-06 DIAGNOSIS — Z01812 Encounter for preprocedural laboratory examination: Secondary | ICD-10-CM | POA: Insufficient documentation

## 2020-04-06 LAB — SARS CORONAVIRUS 2 (TAT 6-24 HRS): SARS Coronavirus 2: NEGATIVE

## 2020-04-08 ENCOUNTER — Other Ambulatory Visit: Payer: Self-pay

## 2020-04-08 ENCOUNTER — Ambulatory Visit: Payer: 59 | Admitting: Certified Registered Nurse Anesthetist

## 2020-04-08 ENCOUNTER — Ambulatory Visit
Admission: RE | Admit: 2020-04-08 | Discharge: 2020-04-08 | Disposition: A | Discharge status: Home / Self Care | Payer: 59 | Attending: Gastroenterology | Admitting: Gastroenterology

## 2020-04-08 ENCOUNTER — Encounter
Admission: RE | Disposition: A | Discharge status: Home / Self Care | Payer: Self-pay | Source: Home / Self Care | Attending: Gastroenterology

## 2020-04-08 ENCOUNTER — Encounter: Payer: Self-pay | Admitting: Gastroenterology

## 2020-04-08 DIAGNOSIS — D122 Benign neoplasm of ascending colon: Secondary | ICD-10-CM | POA: Diagnosis not present

## 2020-04-08 DIAGNOSIS — D126 Benign neoplasm of colon, unspecified: Secondary | ICD-10-CM | POA: Diagnosis not present

## 2020-04-08 DIAGNOSIS — K635 Polyp of colon: Secondary | ICD-10-CM | POA: Diagnosis not present

## 2020-04-08 DIAGNOSIS — Z1211 Encounter for screening for malignant neoplasm of colon: Secondary | ICD-10-CM | POA: Diagnosis present

## 2020-04-08 DIAGNOSIS — Z79899 Other long term (current) drug therapy: Secondary | ICD-10-CM | POA: Diagnosis not present

## 2020-04-08 DIAGNOSIS — I10 Essential (primary) hypertension: Secondary | ICD-10-CM | POA: Insufficient documentation

## 2020-04-08 DIAGNOSIS — Z8249 Family history of ischemic heart disease and other diseases of the circulatory system: Secondary | ICD-10-CM | POA: Insufficient documentation

## 2020-04-08 HISTORY — PX: COLONOSCOPY WITH PROPOFOL: SHX5780

## 2020-04-08 SURGERY — COLONOSCOPY WITH PROPOFOL
Anesthesia: General

## 2020-04-08 MED ORDER — LIDOCAINE HCL (CARDIAC) PF 100 MG/5ML IV SOSY
PREFILLED_SYRINGE | INTRAVENOUS | Status: DC | PRN
  Administered 2020-04-08: 100 mg via INTRAVENOUS

## 2020-04-08 MED ORDER — PROPOFOL 10 MG/ML IV BOLUS
INTRAVENOUS | Status: DC | PRN
  Administered 2020-04-08: 100 mg via INTRAVENOUS

## 2020-04-08 MED ORDER — LIDOCAINE HCL (PF) 1 % IJ SOLN
INTRAMUSCULAR | Status: AC
  Filled 2020-04-08: qty 2

## 2020-04-08 MED ORDER — SODIUM CHLORIDE 0.9 % IV SOLN: INTRAVENOUS | Status: DC

## 2020-04-08 MED ORDER — PROPOFOL 500 MG/50ML IV EMUL
INTRAVENOUS | Status: DC | PRN
  Administered 2020-04-08: 130 ug/kg/min via INTRAVENOUS

## 2020-04-08 NOTE — Anesthesia Preprocedure Evaluation (Signed)
Anesthesia Evaluation  Patient identified by MRN, date of birth, ID band Patient awake    Reviewed: Allergy & Precautions, NPO status , Patient's Chart, lab work & pertinent test results  History of Anesthesia Complications Negative for: history of anesthetic complications  Airway Mallampati: II  TM Distance: >3 FB Neck ROM: Full    Dental no notable dental hx.    Pulmonary neg pulmonary ROS, neg sleep apnea, neg COPD,    breath sounds clear to auscultation- rhonchi (-) wheezing      Cardiovascular Exercise Tolerance: Good hypertension, Pt. on medications (-) CAD, (-) Past MI, (-) Cardiac Stents and (-) CABG  Rhythm:Regular Rate:Normal - Systolic murmurs and - Diastolic murmurs    Neuro/Psych neg Seizures negative neurological ROS  negative psych ROS   GI/Hepatic Neg liver ROS, GERD  ,  Endo/Other  negative endocrine ROSneg diabetes  Renal/GU negative Renal ROS     Musculoskeletal negative musculoskeletal ROS (+)   Abdominal (+) + obese,   Peds  Hematology  (+) anemia ,   Anesthesia Other Findings Past Medical History: No date: Family history of pancreatic cancer     Comment:  4/21 cancer genetic testing letter sent No date: GERD (gastroesophageal reflux disease)     Comment:  especially since gallbladder issues No date: Hypertension 2013: Lump or mass in breast     Comment:  benign. left breast 2017: Squamous cell cancer of skin of forearm, left   Reproductive/Obstetrics                             Anesthesia Physical Anesthesia Plan  ASA: II  Anesthesia Plan: General   Post-op Pain Management:    Induction: Intravenous  PONV Risk Score and Plan: 2 and Propofol infusion  Airway Management Planned: Natural Airway  Additional Equipment:   Intra-op Plan:   Post-operative Plan:   Informed Consent: I have reviewed the patients History and Physical, chart, labs and  discussed the procedure including the risks, benefits and alternatives for the proposed anesthesia with the patient or authorized representative who has indicated his/her understanding and acceptance.     Dental advisory given  Plan Discussed with: CRNA and Anesthesiologist  Anesthesia Plan Comments:         Anesthesia Quick Evaluation

## 2020-04-08 NOTE — Anesthesia Postprocedure Evaluation (Signed)
Anesthesia Post Note  Patient: Jessica Harmon  Procedure(s) Performed: COLONOSCOPY WITH PROPOFOL (N/A )  Patient location during evaluation: Endoscopy Anesthesia Type: General Level of consciousness: awake and alert and oriented Pain management: pain level controlled Vital Signs Assessment: post-procedure vital signs reviewed and stable Respiratory status: spontaneous breathing, nonlabored ventilation and respiratory function stable Cardiovascular status: blood pressure returned to baseline and stable Postop Assessment: no signs of nausea or vomiting Anesthetic complications: no   No complications documented.   Last Vitals:  Vitals:   04/08/20 1134 04/08/20 1144  BP: 102/73 116/74  Pulse: 88 68  Resp: 17 11  Temp: (!) 36.3 C   SpO2: 100% 97%    Last Pain:  Vitals:   04/08/20 1204  TempSrc:   PainSc: 0-No pain                 Isaiah Cianci

## 2020-04-08 NOTE — Transfer of Care (Addendum)
Immediate Anesthesia Transfer of Care Note  Patient: Jessica Harmon  Procedure(s) Performed: COLONOSCOPY WITH PROPOFOL (N/A )  Patient Location: PACU and Endoscopy Unit  Anesthesia Type:General  Level of Consciousness: awake, oriented, drowsy and patient cooperative  Airway & Oxygen Therapy: Patient Spontanous Breathing  Post-op Assessment: Report given to RN and Post -op Vital signs reviewed and stable  Post vital signs: Reviewed  Last Vitals:  Vitals Value Taken Time  BP 102/73  1134  Temp    Pulse 83 1134  Resp 10 1134  SpO2 99 1134    Last Pain:  Vitals:   04/08/20 1134  TempSrc: Temporal  PainSc: 0-No pain         Complications: No complications documented.

## 2020-04-08 NOTE — Op Note (Signed)
[facility] Gastroenterology Patient Name: Jessica Harmon Procedure Date: 04/08/2020 11:07 AM MRN: [id number] Account #: [id number] Date of Birth: 1966-02-02 Admit Type: Outpatient Age: 54 Room: [facility] ENDO ROOM 3 Gender: Female Note Status: Finalized Procedure:             Colonoscopy Indications:           Screening for colorectal malignant neoplasm Providers:             Jonathon Bellows MD, MD Referring MD:          Precious Bard, MD (Referring MD) Medicines:             Monitored Anesthesia Care Complications:         No immediate complications. Procedure:             Pre-Anesthesia Assessment:                        - Prior to the procedure, a History and Physical was                         performed, and patient medications, allergies and                         sensitivities were reviewed. The patient's tolerance                         of previous anesthesia was reviewed.                        - The risks and benefits of the procedure and the                         sedation options and risks were discussed with the                         patient. All questions were answered and informed                         consent was obtained.                        - The risks and benefits of the procedure and the                         sedation options and risks were discussed with the                         patient. All questions were answered and informed                         consent was obtained.                        - ASA Grade Assessment: II - A patient with mild                         systemic disease.                        After obtaining informed consent, the  colonoscope was                         passed under direct vision. Throughout the procedure,                         the patient's blood pressure, pulse, and oxygen                         saturations were monitored continuously. The                         Colonoscope was introduced  through the anus and                         advanced to the the cecum, identified by the                         appendiceal orifice. The colonoscopy was performed                         with ease. The patient tolerated the procedure well.                         The quality of the bowel preparation was excellent. Findings:      The perianal and digital rectal examinations were normal.      Two sessile polyps were found in the sigmoid colon and ascending colon.       The polyps were 6 to 8 mm in size. These polyps were removed with a cold       snare. Resection and retrieval were complete.      The exam was otherwise without abnormality on direct and retroflexion       views. Impression:            - Two 6 to 8 mm polyps in the sigmoid colon and in the                         ascending colon, removed with a cold snare. Resected                         and retrieved.                        - The examination was otherwise normal on direct and                         retroflexion views. Recommendation:        - Discharge patient to home (with escort).                        - Resume previous diet.                        - Continue present medications.                        - Await pathology results.                        -  Repeat colonoscopy for surveillance based on                         pathology results. Procedure Code(s):     --- Professional ---                        [id number], Colonoscopy, flexible; with removal of                         tumor(s), polyp(s), or other lesion(s) by snare                         technique Diagnosis Code(s):     --- Professional ---                        Z12.11, Encounter for screening for malignant neoplasm                         of colon                        K63.5, Polyp of colon CPT copyright 2019 American Medical Association. All rights reserved. The codes documented in this report are preliminary and upon coder review may  be revised to  meet current compliance requirements. Jonathon Bellows, MD Jonathon Bellows MD, MD 04/08/2020 11:32:23 AM This report has been signed electronically. Number of Addenda: 0 Note Initiated On: 04/08/2020 11:07 AM Scope Withdrawal Time: 0 hours 11 minutes 7 seconds  Total Procedure Duration: 0 hours 13 minutes 22 seconds  Estimated Blood Loss:  Estimated blood loss: none.      [facility]

## 2020-04-08 NOTE — H&P (Signed)
Jonathon Bellows, MD [address], [place], [place], [place], [address] [address], [place], [place], [place], [address] Phone: [phone]  Fax: [phone]  Primary Care Physician:  Marinda Elk, MD   Pre-Procedure History & Physical: HPI:  Jessica Harmon is a 54 y.o. female is here for an colonoscopy.   Past Medical History:  Diagnosis Date  . Family history of pancreatic cancer    4/21 cancer genetic testing letter sent  . GERD (gastroesophageal reflux disease)    especially since gallbladder issues  . Hypertension   . Lump or mass in breast 2013   benign. left breast  . Squamous cell cancer of skin of forearm, left 2017    Past Surgical History:  Procedure Laterality Date  . BREAST BIOPSY Right 2013   needle  . CESAREAN SECTION  1992,2000   x 2  . CHOLECYSTECTOMY N/A 12/11/2017   Procedure: LAPAROSCOPIC CHOLECYSTECTOMY;  Surgeon: Herbert Pun, MD;  Location: [facility] ORS;  Service: General;  Laterality: N/A;  . DILATION AND CURETTAGE OF UTERUS    . excision of squamous cell cancer of left forearm  2017    Prior to Admission medications   Medication Sig Start Date End Date Taking? Authorizing Provider  cetirizine (ZYRTEC) 10 MG tablet Take by mouth.   Yes [provider]  lisinopril (PRINIVIL,ZESTRIL) 10 MG tablet Take 10 mg by mouth daily. In the morning   Yes [provider]  ibuprofen (ADVIL,MOTRIN) 200 MG tablet Take 600 mg by mouth every 8 (eight) hours as needed for headache or mild pain.    [provider]    Allergies as of 03/02/2020  . (No Known Allergies)    Family History  Problem Relation Age of Onset  . Breast cancer Paternal Aunt 66  . Hyperlipidemia Mother   . Hypertension Mother   . Neuropathy Mother        in feet  . Hypertension Father   . Hypertension Sister   . Hypertension Brother   . Hypertension Brother   . Pancreatic cancer Maternal Aunt 60    Social History   Socioeconomic  History  . Marital status: Married    Spouse name: Not on file  . Number of children: 2  . Years of education: Not on file  . Highest education level: Not on file  Occupational History  . Occupation: Estate agent  Tobacco Use  . Smoking status: Never Smoker  . Smokeless tobacco: Never Used  Vaping Use  . Vaping Use: Never used  Substance and Sexual Activity  . Alcohol use: No  . Drug use: No  . Sexual activity: Yes    Partners: Male    Birth control/protection: Post-menopausal  Other Topics Concern  . Not on file  Social History Narrative   Moved to near [place] course in 2017/18.  [name] graduated from [employer] and now lives in [place]. Son Hold graduated from high school in 2018 and is attending [employer]   Social Determinants of Health   Financial Resource Strain:   . Difficulty of Paying Living Expenses:   Food Insecurity:   . Worried About Charity fundraiser in the Last Year:   . Arboriculturist in the Last Year:   Transportation Needs:   . Film/video editor (Medical):   Marland Kitchen Lack of Transportation (Non-Medical):   Physical Activity:   . Days of Exercise per Week:   . Minutes of Exercise per  Session:   Stress:   . Feeling of Stress :   Social Connections:   . Frequency of Communication with Friends and Family:   . Frequency of Social Gatherings with Friends and Family:   . Attends Religious Services:   . Active Member of Clubs or Organizations:   . Attends Archivist Meetings:   Marland Kitchen Marital Status:   Intimate Partner Violence:   . Fear of Current or Ex-Partner:   . Emotionally Abused:   Marland Kitchen Physically Abused:   . Sexually Abused:     Review of Systems: See HPI, otherwise negative ROS  Physical Exam: BP (!) 147/82   Pulse 77   Temp (!) 97.5 F (36.4 C) (Temporal)   Resp 20   Ht 5\' 3"  (1.6 m)   Wt 79.8 kg   LMP 10/22/2013   SpO2 100%   BMI 31.18 kg/m  General:   Alert,  pleasant and cooperative in NAD Head:  Normocephalic and  atraumatic. Neck:  Supple; no masses or thyromegaly. Lungs:  Clear throughout to auscultation, normal respiratory effort.    Heart:  +S1, +S2, Regular rate and rhythm, No edema. Abdomen:  Soft, nontender and nondistended. Normal bowel sounds, without guarding, and without rebound.   Neurologic:  Alert and  oriented x4;  grossly normal neurologically.  Impression/Plan: Jessica Harmon is here for an colonoscopy to be performed for Screening colonoscopy average risk   Risks, benefits, limitations, and alternatives regarding  colonoscopy have been reviewed with the patient.  Questions have been answered.  All parties agreeable.   Jonathon Bellows, MD  04/08/2020, 11:05 AM

## 2020-04-11 ENCOUNTER — Encounter: Payer: Self-pay | Admitting: Gastroenterology

## 2020-04-11 LAB — SURGICAL PATHOLOGY

## 2020-07-27 ENCOUNTER — Emergency Department (HOSPITAL_COMMUNITY)
Admission: EM | Admit: 2020-07-27 | Discharge: 2020-07-27 | Disposition: A | Discharge status: Home / Self Care | Payer: 59 | Attending: Emergency Medicine | Admitting: Emergency Medicine

## 2020-07-27 ENCOUNTER — Other Ambulatory Visit: Payer: Self-pay

## 2020-07-27 DIAGNOSIS — Z5321 Procedure and treatment not carried out due to patient leaving prior to being seen by health care provider: Secondary | ICD-10-CM | POA: Diagnosis not present

## 2020-07-27 DIAGNOSIS — R1310 Dysphagia, unspecified: Secondary | ICD-10-CM | POA: Insufficient documentation

## 2020-07-27 NOTE — ED Triage Notes (Signed)
Pt arrived via walk in, states she started developing some difficulty swallowing after eating prior to arrival. Pt able speak in full sentences, able to swallow secretions, but painful.

## 2020-08-24 ENCOUNTER — Other Ambulatory Visit: Payer: Self-pay

## 2020-08-24 ENCOUNTER — Ambulatory Visit (INDEPENDENT_AMBULATORY_CARE_PROVIDER_SITE_OTHER): Payer: 59 | Admitting: Gastroenterology

## 2020-08-24 VITALS — BP 136/82 | HR 67 | Temp 97.7°F | Ht 63.0 in | Wt 178.0 lb

## 2020-08-24 DIAGNOSIS — R1310 Dysphagia, unspecified: Secondary | ICD-10-CM

## 2020-08-24 MED ORDER — OMEPRAZOLE 40 MG PO CPDR: 40.0000 mg | DELAYED_RELEASE_CAPSULE | Freq: Every day | ORAL | 1 refills | Status: AC

## 2020-08-24 NOTE — Progress Notes (Signed)
Jonathon Bellows MD, MRCP(U.K) [address]  [place]  [place], [place] [address]  Main: [phone]  Fax: [phone]   Gastroenterology Consultation  Referring Provider:     Marinda Elk, MD Primary Care Physician:  Marinda Elk, MD Primary Gastroenterologist:  Dr. Jonathon Bellows  Reason for Consultation:    Dysphagia       HPI:   Jessica Harmon is a 54 y.o. y/o female referred for consultation & management  by Dr. Carrie Mew, Wilhemena Durie, MD.     She states that for the past few months she has had difficulty swallowing certain foods such as rice and saw other solid material.  Often she feels like that the food gets stuck in the middle of her throat and eventually goes down.  Recently she actually presented to the emergency room on her birthday after eating some food which she felt got stuck in her food pipe and while she waited to the emergency room it spontaneously passed.  She does state that she has a history of acid reflux and heartburn.  She denies any weight loss.  She is not a smoker.  No family history of esophageal cancer.  Denies any history.  She probably has gained some weight recently.  Past Medical History:  Diagnosis Date  . Family history of pancreatic cancer    4/21 cancer genetic testing letter sent  . GERD (gastroesophageal reflux disease)    especially since gallbladder issues  . Hypertension   . Lump or mass in breast 2013   benign. left breast  . Squamous cell cancer of skin of forearm, left 2017    Past Surgical History:  Procedure Laterality Date  . BREAST BIOPSY Right 2013   needle  . CESAREAN SECTION  1992,2000   x 2  . CHOLECYSTECTOMY N/A 12/11/2017   Procedure: LAPAROSCOPIC CHOLECYSTECTOMY;  Surgeon: Herbert Pun, MD;  Location: [facility] ORS;  Service: General;  Laterality: N/A;  . COLONOSCOPY WITH PROPOFOL N/A 04/08/2020   Procedure: COLONOSCOPY WITH PROPOFOL;  Surgeon: Jonathon Bellows, MD;  Location: [facility] ENDOSCOPY;  Service:  Gastroenterology;  Laterality: N/A;  . DILATION AND CURETTAGE OF UTERUS    . excision of squamous cell cancer of left forearm  2017    Prior to Admission medications   Medication Sig Start Date End Date Taking? Authorizing Provider  cetirizine (ZYRTEC) 10 MG tablet Take by mouth.    [provider]  ibuprofen (ADVIL,MOTRIN) 200 MG tablet Take 600 mg by mouth every 8 (eight) hours as needed for headache or mild pain.    [provider]  lisinopril (PRINIVIL,ZESTRIL) 10 MG tablet Take 10 mg by mouth daily. In the morning    [provider]    Family History  Problem Relation Age of Onset  . Breast cancer Paternal Aunt 20  . Hyperlipidemia Mother   . Hypertension Mother   . Neuropathy Mother        in feet  . Hypertension Father   . Hypertension Sister   . Hypertension Brother   . Hypertension Brother   . Pancreatic cancer Maternal Aunt 8     Social History   Tobacco Use  . Smoking status: Never Smoker  . Smokeless tobacco: Never Used  Vaping Use  . Vaping Use: Never used  Substance Use Topics  . Alcohol use: No  . Drug use: No    Allergies as of 08/24/2020  . (No Known Allergies)    Review of Systems:  All systems reviewed and negative except where noted in HPI.   Physical Exam:  BP 136/82   Pulse 67   Temp 97.7 F (36.5 C)   Ht 5\' 3"  (1.6 m)   Wt 178 lb (80.7 kg)   LMP 10/22/2013   BMI 31.53 kg/m  Patient's last menstrual period was 10/22/2013. Psych:  Alert and cooperative. Normal mood and affect. General:   Alert,  Well-developed, well-nourished, pleasant and cooperative in NAD Head:  Normocephalic and atraumatic. Eyes:  Sclera clear, no icterus.   Conjunctiva pink. Lungs:  Respirations even and unlabored.  Clear throughout to auscultation.   No wheezes, crackles, or rhonchi. No acute distress. Heart:  Regular rate and rhythm; no murmurs, clicks, rubs, or gallops. Abdomen:  Normal bowel sounds.  No bruits.  Soft, non-tender  and non-distended without masses, hepatosplenomegaly or hernias noted.  No guarding or rebound tenderness.    Neurologic:  Alert and oriented x3;  grossly normal neurologically. Psych:  Alert and cooperative. Normal mood and affect.  Imaging Studies: No results found.  Assessment and Plan:   Jessica Harmon is a 54 y.o. y/o female has been referred for dysphagia for solids greater than liquids ongoing for a few months.  Preceding history of acid reflux.  Likely to have a either a Schatzki's ring or stricture.  Plan 1.  EGD and will perform dilation if needed and rule out eosinophilic esophagitis 2.  Commence on Prilosec 40 mg once a day 30 minutes before breakfast  I have discussed alternative options, risks & benefits,  which include, but are not limited to, bleeding, infection, perforation,respiratory complication & drug reaction.  The patient agrees with this plan & written consent will be obtained.     Follow up in 8 weeks telephone visit  Dr Jonathon Bellows MD,MRCP(U.K)

## 2020-09-20 ENCOUNTER — Telehealth: Payer: Self-pay | Admitting: Gastroenterology

## 2020-09-20 NOTE — Telephone Encounter (Signed)
Patient called to cancel procedure scheduled on 09/23/20 and would like a call back to reschedule. Clinical staff was informed.

## 2020-09-21 ENCOUNTER — Other Ambulatory Visit: Admission: RE | Admit: 2020-09-21 | Payer: 59 | Source: Ambulatory Visit

## 2020-09-21 ENCOUNTER — Telehealth: Payer: Self-pay

## 2020-09-21 ENCOUNTER — Other Ambulatory Visit: Payer: Self-pay

## 2020-09-21 NOTE — Telephone Encounter (Signed)
Returned patients call. LVM to call office back. 

## 2020-09-21 NOTE — Progress Notes (Signed)
Procedure rescheduled and updated instructions sent via my chart and mail.

## 2020-09-21 NOTE — Telephone Encounter (Signed)
Patient returning Jovon's call to resch procedure from this Friday 12.3.21. Pt states she has to go out of town for a funeral. Please call her work number back to [employer]. [phone]

## 2020-11-02 ENCOUNTER — Other Ambulatory Visit: Payer: Self-pay

## 2020-11-02 ENCOUNTER — Other Ambulatory Visit
Admission: RE | Admit: 2020-11-02 | Discharge: 2020-11-02 | Disposition: A | Discharge status: Home / Self Care | Payer: 59 | Source: Ambulatory Visit | Attending: Gastroenterology | Admitting: Gastroenterology

## 2020-11-02 DIAGNOSIS — Z20822 Contact with and (suspected) exposure to covid-19: Secondary | ICD-10-CM | POA: Insufficient documentation

## 2020-11-02 DIAGNOSIS — Z01812 Encounter for preprocedural laboratory examination: Secondary | ICD-10-CM | POA: Insufficient documentation

## 2020-11-02 LAB — SARS CORONAVIRUS 2 (TAT 6-24 HRS): SARS Coronavirus 2: NEGATIVE

## 2020-11-03 ENCOUNTER — Encounter: Payer: Self-pay | Admitting: Gastroenterology

## 2020-11-04 ENCOUNTER — Encounter: Payer: Self-pay | Admitting: Gastroenterology

## 2020-11-04 ENCOUNTER — Ambulatory Visit: Payer: 59 | Admitting: Certified Registered"

## 2020-11-04 ENCOUNTER — Encounter
Admission: RE | Disposition: A | Discharge status: Home / Self Care | Payer: Self-pay | Source: Home / Self Care | Attending: Gastroenterology

## 2020-11-04 ENCOUNTER — Ambulatory Visit
Admission: RE | Admit: 2020-11-04 | Discharge: 2020-11-04 | Disposition: A | Discharge status: Home / Self Care | Payer: 59 | Attending: Gastroenterology | Admitting: Gastroenterology

## 2020-11-04 DIAGNOSIS — K2100 Gastro-esophageal reflux disease with esophagitis, without bleeding: Secondary | ICD-10-CM | POA: Diagnosis not present

## 2020-11-04 DIAGNOSIS — Z79899 Other long term (current) drug therapy: Secondary | ICD-10-CM | POA: Diagnosis not present

## 2020-11-04 DIAGNOSIS — K449 Diaphragmatic hernia without obstruction or gangrene: Secondary | ICD-10-CM | POA: Insufficient documentation

## 2020-11-04 DIAGNOSIS — K2950 Unspecified chronic gastritis without bleeding: Secondary | ICD-10-CM | POA: Diagnosis not present

## 2020-11-04 DIAGNOSIS — Z9049 Acquired absence of other specified parts of digestive tract: Secondary | ICD-10-CM | POA: Insufficient documentation

## 2020-11-04 DIAGNOSIS — B9681 Helicobacter pylori [H. pylori] as the cause of diseases classified elsewhere: Secondary | ICD-10-CM | POA: Diagnosis not present

## 2020-11-04 DIAGNOSIS — Z800 Family history of malignant neoplasm of digestive organs: Secondary | ICD-10-CM | POA: Diagnosis not present

## 2020-11-04 DIAGNOSIS — K259 Gastric ulcer, unspecified as acute or chronic, without hemorrhage or perforation: Secondary | ICD-10-CM | POA: Insufficient documentation

## 2020-11-04 DIAGNOSIS — K317 Polyp of stomach and duodenum: Secondary | ICD-10-CM | POA: Diagnosis not present

## 2020-11-04 DIAGNOSIS — R1310 Dysphagia, unspecified: Secondary | ICD-10-CM

## 2020-11-04 HISTORY — PX: ESOPHAGOGASTRODUODENOSCOPY (EGD) WITH PROPOFOL: SHX5813

## 2020-11-04 SURGERY — ESOPHAGOGASTRODUODENOSCOPY (EGD) WITH PROPOFOL
Anesthesia: General

## 2020-11-04 MED ORDER — PROPOFOL 10 MG/ML IV BOLUS
INTRAVENOUS | Status: AC
  Filled 2020-11-04: qty 40

## 2020-11-04 MED ORDER — LIDOCAINE HCL (CARDIAC) PF 100 MG/5ML IV SOSY
PREFILLED_SYRINGE | INTRAVENOUS | Status: DC | PRN
  Administered 2020-11-04: 50 mg via INTRAVENOUS

## 2020-11-04 MED ORDER — PROPOFOL 10 MG/ML IV BOLUS
INTRAVENOUS | Status: DC | PRN
  Administered 2020-11-04: 70 mg via INTRAVENOUS

## 2020-11-04 MED ORDER — SODIUM CHLORIDE 0.9 % IV SOLN
INTRAVENOUS | Status: DC
  Administered 2020-11-04: 1000 mL via INTRAVENOUS

## 2020-11-04 MED ORDER — PROPOFOL 500 MG/50ML IV EMUL
INTRAVENOUS | Status: DC | PRN
  Administered 2020-11-04: 175 ug/kg/min via INTRAVENOUS

## 2020-11-04 NOTE — Anesthesia Preprocedure Evaluation (Signed)
Anesthesia Evaluation  Patient identified by MRN, date of birth, ID band Patient awake    Reviewed: Allergy & Precautions, NPO status , Patient's Chart, lab work & pertinent test results  History of Anesthesia Complications Negative for: history of anesthetic complications  Airway Mallampati: II  TM Distance: >3 FB Neck ROM: Full    Dental no notable dental hx. (+) Teeth Intact   Pulmonary neg pulmonary ROS, neg sleep apnea, neg COPD,    breath sounds clear to auscultation- rhonchi (-) wheezing      Cardiovascular Exercise Tolerance: Good hypertension, Pt. on medications (-) CAD, (-) Past MI, (-) Cardiac Stents and (-) CABG  Rhythm:Regular Rate:Normal - Systolic murmurs and - Diastolic murmurs    Neuro/Psych neg Seizures negative neurological ROS  negative psych ROS   GI/Hepatic Neg liver ROS, GERD  Medicated,  Endo/Other  negative endocrine ROSneg diabetes  Renal/GU negative Renal ROS     Musculoskeletal negative musculoskeletal ROS (+)   Abdominal (+) + obese,   Peds  Hematology  (+) anemia ,   Anesthesia Other Findings Past Medical History: No date: Family history of pancreatic cancer     Comment:  4/21 cancer genetic testing letter sent No date: GERD (gastroesophageal reflux disease)     Comment:  especially since gallbladder issues No date: Hypertension 2013: Lump or mass in breast     Comment:  benign. left breast 2017: Squamous cell cancer of skin of forearm, left   Reproductive/Obstetrics                             Anesthesia Physical  Anesthesia Plan  ASA: II  Anesthesia Plan: General   Post-op Pain Management:    Induction: Intravenous  PONV Risk Score and Plan: 3 and Propofol infusion  Airway Management Planned: Natural Airway  Additional Equipment: None  Intra-op Plan:   Post-operative Plan:   Informed Consent: I have reviewed the patients History and  Physical, chart, labs and discussed the procedure including the risks, benefits and alternatives for the proposed anesthesia with the patient or authorized representative who has indicated his/her understanding and acceptance.     Dental advisory given  Plan Discussed with: CRNA and Anesthesiologist  Anesthesia Plan Comments: (Discussed risks of anesthesia with patient, including possibility of difficulty with spontaneous ventilation under anesthesia necessitating airway intervention, PONV, and rare risks such as cardiac or respiratory or neurological events. Patient understands.)        Anesthesia Quick Evaluation

## 2020-11-04 NOTE — Transfer of Care (Signed)
Immediate Anesthesia Transfer of Care Note  Patient: Jessica Harmon  Procedure(s) Performed: ESOPHAGOGASTRODUODENOSCOPY (EGD) WITH PROPOFOL (N/A )  Patient Location: PACU  Anesthesia Type:General  Level of Consciousness: awake, alert  and oriented  Airway & Oxygen Therapy: Patient Spontanous Breathing and Patient connected to nasal cannula oxygen  Post-op Assessment: Report given to RN and Post -op Vital signs reviewed and stable  Post vital signs: Reviewed and stable  Last Vitals:  Vitals Value Taken Time  BP    Temp    Pulse    Resp    SpO2      Last Pain:  Vitals:   11/04/20 0712  TempSrc: Temporal  PainSc: 0-No pain         Complications: No complications documented.

## 2020-11-04 NOTE — Op Note (Signed)
[facility] Gastroenterology Patient Name: Jessica Harmon Procedure Date: 11/04/2020 8:01 AM MRN: [id number] Account #: [id number] Date of Birth: [birth year] Admit Type: Outpatient Age: 55 Room: [facility] ENDO ROOM 4 Gender: Female Note Status: Finalized Procedure:             Upper GI endoscopy Indications:           Dysphagia Providers:             Jonathon Bellows MD, MD Referring MD:          Precious Bard, MD (Referring MD) Medicines:             Monitored Anesthesia Care Complications:         No immediate complications. Procedure:             Pre-Anesthesia Assessment:                        - Prior to the procedure, a History and Physical was                         performed, and patient medications, allergies and                         sensitivities were reviewed. The patient's tolerance                         of previous anesthesia was reviewed.                        - The risks and benefits of the procedure and the                         sedation options and risks were discussed with the                         patient. All questions were answered and informed                         consent was obtained.                        - ASA Grade Assessment: II - A patient with mild                         systemic disease.                        After obtaining informed consent, the endoscope was                         passed under direct vision. Throughout the procedure,                         the patient's blood pressure, pulse, and oxygen                         saturations were monitored continuously. The Endoscope                         was introduced through the mouth, and advanced  to the                         third part of duodenum. The upper GI endoscopy was                         accomplished with ease. The patient tolerated the                         procedure well. Findings:      The examined duodenum was normal.      Patchy moderate  inflammation characterized by congestion (edema) and       erosions was found in the gastric antrum. Biopsies were taken with a       cold forceps for histology.      Multiple 8 mm sessile polyps with no stigmata of recent bleeding were       found in the gastric body. Biopsies were taken with a cold forceps for       histology.      A medium-sized hiatal hernia was present.      One tongue of salmon-colored mucosa was present. No other visible       abnormalities were present. The maximum longitudinal extent of these       esophageal mucosal changes was 0.8 cm in length. Biopsies were taken       with a cold forceps for histology.      Mucosal changes including feline appearance were found in the lower       third of the esophagus. Esophageal findings were graded using the       Eosinophilic Esophagitis Endoscopic Reference Score (EoE-EREFS) as:       Edema Grade 1 Present (decreased clarity or absence of vascular       markings), Rings Grade 1 Mild (subtle circumferential ridges seen on       esophageal distension), Exudates Grade 0 None (no white lesions seen),       Furrows Grade 1 Present (vertical lines with or without visible depth)       and Stricture none (no stricture found). Biopsies were taken with a cold       forceps for histology.      The cardia and gastric fundus were normal on retroflexion. Impression:            - Normal examined duodenum.                        - Gastritis. Biopsied.                        - Multiple gastric polyps. Biopsied.                        - Medium-sized hiatal hernia.                        - Salmon-colored mucosa suspicious for short-segment                         Barrett's esophagus. Biopsied.                        - Esophageal mucosal changes suggestive of  eosinophilic esophagitis. Biopsied. Recommendation:        - Discharge patient to home (with escort).                        - Resume previous diet.                         - Continue present medications.                        - Return to my office as previously scheduled. Procedure Code(s):     --- Professional ---                        [phone], Esophagogastroduodenoscopy, flexible,                         transoral; with biopsy, single or multiple Diagnosis Code(s):     --- Professional ---                        K22.8, Other specified diseases of esophagus                        K29.70, Gastritis, unspecified, without bleeding                        K31.7, Polyp of stomach and duodenum                        K44.9, Diaphragmatic hernia without obstruction or                         gangrene                        R13.10, Dysphagia, unspecified CPT copyright 2019 American Medical Association. All rights reserved. The codes documented in this report are preliminary and upon coder review may  be revised to meet current compliance requirements. Jonathon Bellows, MD Jonathon Bellows MD, MD 11/04/2020 8:18:03 AM This report has been signed electronically. Number of Addenda: 0 Note Initiated On: 11/04/2020 8:01 AM Estimated Blood Loss:  Estimated blood loss: none.      [facility]

## 2020-11-04 NOTE — H&P (Signed)
Jessica Bellows, MD [address] [place], [place], [place], [address] [address], [place], [place], [place], [address] Phone: [phone]  Fax: [phone]  Primary Care Physician:  Marinda Elk, MD   Pre-Procedure History & Physical: HPI:  Jessica Harmon is a 55 y.o. female is here for an endoscopy    Past Medical History:  Diagnosis Date  . Family history of pancreatic cancer    4/21 cancer genetic testing letter sent  . GERD (gastroesophageal reflux disease)    especially since gallbladder issues  . Hypertension   . Lump or mass in breast 2013   benign. left breast  . Squamous cell cancer of skin of forearm, left 2017    Past Surgical History:  Procedure Laterality Date  . BREAST BIOPSY Right 2013   needle  . CESAREAN SECTION  1992,2000   x 2  . CHOLECYSTECTOMY N/A 12/11/2017   Procedure: LAPAROSCOPIC CHOLECYSTECTOMY;  Surgeon: Herbert Pun, MD;  Location: [facility] ORS;  Service: General;  Laterality: N/A;  . COLONOSCOPY WITH PROPOFOL N/A 04/08/2020   Procedure: COLONOSCOPY WITH PROPOFOL;  Surgeon: Jessica Bellows, MD;  Location: [facility] ENDOSCOPY;  Service: Gastroenterology;  Laterality: N/A;  . DILATION AND CURETTAGE OF UTERUS    . excision of squamous cell cancer of left forearm  2017    Prior to Admission medications   Medication Sig Start Date End Date Taking? Authorizing Provider  cetirizine (ZYRTEC) 10 MG tablet Take by mouth.   Yes [provider]  lisinopril (PRINIVIL,ZESTRIL) 10 MG tablet Take 10 mg by mouth daily. In the morning   Yes [provider]  omeprazole (PRILOSEC) 40 MG capsule Take 1 capsule (40 mg total) by mouth daily. 08/24/20  Yes Jessica Bellows, MD  ibuprofen (ADVIL,MOTRIN) 200 MG tablet Take 600 mg by mouth every 8 (eight) hours as needed for headache or mild pain.    [provider]    Allergies as of 08/24/2020  . (No Known Allergies)    Family History  Problem Relation Age of Onset  . Breast  cancer Paternal Aunt 8  . Hyperlipidemia Mother   . Hypertension Mother   . Neuropathy Mother        in feet  . Hypertension Father   . Hypertension Sister   . Hypertension Brother   . Hypertension Brother   . Pancreatic cancer Maternal Aunt 60    Social History   Socioeconomic History  . Marital status: Married    Spouse name: Not on file  . Number of children: 2  . Years of education: Not on file  . Highest education level: Not on file  Occupational History  . Occupation: Estate agent  Tobacco Use  . Smoking status: Never Smoker  . Smokeless tobacco: Never Used  Vaping Use  . Vaping Use: Never used  Substance and Sexual Activity  . Alcohol use: No  . Drug use: No  . Sexual activity: Yes    Partners: Male    Birth control/protection: Post-menopausal  Other Topics Concern  . Not on file  Social History Narrative   Moved to near [place] course in 2017/18.  [name] graduated from [employer] and now lives in [place]. Son Hold graduated from high school in 2018 and is attending [employer]   Social Determinants of Health   Financial Resource Strain: Not on file  Food Insecurity: Not on file  Transportation Needs: Not on file  Physical Activity: Not on file  Stress:  Not on file  Social Connections: Not on file  Intimate Partner Violence: Not on file    Review of Systems: See HPI, otherwise negative ROS  Physical Exam: BP (!) 151/88   Pulse 71   Temp (!) 97 F (36.1 C) (Temporal)   Resp 20   Ht 5\' 3"  (1.6 m)   Wt 80.9 kg   LMP 10/22/2013   SpO2 100%   BMI 31.59 kg/m  General:   Alert,  pleasant and cooperative in NAD Head:  Normocephalic and atraumatic. Neck:  Supple; no masses or thyromegaly. Lungs:  Clear throughout to auscultation, normal respiratory effort.    Heart:  +S1, +S2, Regular rate and rhythm, No edema. Abdomen:  Soft, nontender and nondistended. Normal bowel sounds, without guarding, and without rebound.   Neurologic:  Alert and   oriented x4;  grossly normal neurologically.  Impression/Plan: [name] is here for an endoscopy  to be performed for  evaluation of dysphagia    Risks, benefits, limitations, and alternatives regarding endoscopy have been reviewed with the patient.  Questions have been answered.  All parties agreeable.   Jessica Bellows, MD  11/04/2020, 8:02 AM

## 2020-11-04 NOTE — Anesthesia Postprocedure Evaluation (Signed)
Anesthesia Post Note  Patient: Jessica Harmon  Procedure(s) Performed: ESOPHAGOGASTRODUODENOSCOPY (EGD) WITH PROPOFOL (N/A )  Patient location during evaluation: Endoscopy Anesthesia Type: General Level of consciousness: awake and alert Pain management: pain level controlled Vital Signs Assessment: post-procedure vital signs reviewed and stable Respiratory status: spontaneous breathing, nonlabored ventilation, respiratory function stable and patient connected to nasal cannula oxygen Cardiovascular status: blood pressure returned to baseline and stable Postop Assessment: no apparent nausea or vomiting Anesthetic complications: no   No complications documented.   Last Vitals:  Vitals:   11/04/20 0712 11/04/20 0818  BP: (!) 151/88 104/66  Pulse: 71 64  Resp: 20 17  Temp: (!) 36.1 C (!) 36.2 C  SpO2: 100% 98%    Last Pain:  Vitals:   11/04/20 0818  TempSrc: Temporal  PainSc: 0-No pain                 Arita Miss

## 2020-11-07 ENCOUNTER — Encounter: Payer: Self-pay | Admitting: Gastroenterology

## 2020-11-15 ENCOUNTER — Other Ambulatory Visit: Payer: Self-pay | Admitting: Physician Assistant

## 2020-11-15 LAB — SURGICAL PATHOLOGY

## 2020-11-27 ENCOUNTER — Encounter: Payer: Self-pay | Admitting: Gastroenterology

## 2021-02-15 ENCOUNTER — Ambulatory Visit: Payer: 59 | Admitting: Obstetrics and Gynecology

## 2021-02-19 ENCOUNTER — Other Ambulatory Visit: Payer: Self-pay | Admitting: Gastroenterology

## 2021-02-22 ENCOUNTER — Ambulatory Visit: Payer: 59 | Admitting: Obstetrics and Gynecology

## 2021-03-15 ENCOUNTER — Ambulatory Visit: Payer: 59 | Admitting: Obstetrics and Gynecology

## 2021-06-01 ENCOUNTER — Other Ambulatory Visit: Payer: Self-pay | Admitting: Physician Assistant

## 2021-06-01 DIAGNOSIS — Z1231 Encounter for screening mammogram for malignant neoplasm of breast: Secondary | ICD-10-CM

## 2021-08-07 ENCOUNTER — Ambulatory Visit
Admission: RE | Admit: 2021-08-07 | Discharge: 2021-08-07 | Disposition: A | Discharge status: Home / Self Care | Payer: 59 | Source: Ambulatory Visit | Attending: Physician Assistant | Admitting: Physician Assistant

## 2021-08-07 ENCOUNTER — Other Ambulatory Visit: Payer: Self-pay

## 2021-08-07 DIAGNOSIS — Z1231 Encounter for screening mammogram for malignant neoplasm of breast: Secondary | ICD-10-CM | POA: Diagnosis present

## 2021-08-17 ENCOUNTER — Other Ambulatory Visit: Payer: Self-pay | Admitting: Physician Assistant

## 2021-08-17 DIAGNOSIS — N6320 Unspecified lump in the left breast, unspecified quadrant: Secondary | ICD-10-CM

## 2021-08-17 DIAGNOSIS — R928 Other abnormal and inconclusive findings on diagnostic imaging of breast: Secondary | ICD-10-CM

## 2021-08-23 ENCOUNTER — Ambulatory Visit
Admission: RE | Admit: 2021-08-23 | Discharge: 2021-08-23 | Disposition: A | Discharge status: Home / Self Care | Payer: 59 | Source: Ambulatory Visit | Attending: Physician Assistant | Admitting: Physician Assistant

## 2021-08-23 ENCOUNTER — Other Ambulatory Visit: Payer: Self-pay

## 2021-08-23 DIAGNOSIS — N6320 Unspecified lump in the left breast, unspecified quadrant: Secondary | ICD-10-CM | POA: Diagnosis present

## 2021-08-23 DIAGNOSIS — R928 Other abnormal and inconclusive findings on diagnostic imaging of breast: Secondary | ICD-10-CM | POA: Diagnosis not present

## 2022-04-17 ENCOUNTER — Ambulatory Visit: Payer: 59 | Admitting: Advanced Practice Midwife

## 2022-04-19 ENCOUNTER — Ambulatory Visit (INDEPENDENT_AMBULATORY_CARE_PROVIDER_SITE_OTHER): Payer: 59 | Admitting: Advanced Practice Midwife

## 2022-04-19 ENCOUNTER — Other Ambulatory Visit (HOSPITAL_COMMUNITY)
Admission: RE | Admit: 2022-04-19 | Discharge: 2022-04-19 | Disposition: A | Discharge status: Home / Self Care | Payer: 59 | Source: Ambulatory Visit | Attending: Obstetrics and Gynecology | Admitting: Obstetrics and Gynecology

## 2022-04-19 ENCOUNTER — Encounter: Payer: Self-pay | Admitting: Advanced Practice Midwife

## 2022-04-19 VITALS — BP 148/98 | Ht 63.0 in | Wt 183.0 lb

## 2022-04-19 DIAGNOSIS — Z1239 Encounter for other screening for malignant neoplasm of breast: Secondary | ICD-10-CM

## 2022-04-19 DIAGNOSIS — Z124 Encounter for screening for malignant neoplasm of cervix: Secondary | ICD-10-CM | POA: Insufficient documentation

## 2022-04-19 DIAGNOSIS — R300 Dysuria: Secondary | ICD-10-CM | POA: Diagnosis not present

## 2022-04-19 DIAGNOSIS — Z01419 Encounter for gynecological examination (general) (routine) without abnormal findings: Secondary | ICD-10-CM

## 2022-04-19 NOTE — Patient Instructions (Signed)
Health Maintenance, Female Adopting a healthy lifestyle and getting preventive care are important in promoting health and wellness. Ask your health care provider about: The right schedule for you to have regular tests and exams. Things you can do on your own to prevent diseases and keep yourself healthy. What should I know about diet, weight, and exercise? Eat a healthy diet  Eat a diet that includes plenty of vegetables, fruits, low-fat dairy products, and lean protein. Do not eat a lot of foods that are high in solid fats, added sugars, or sodium. Maintain a healthy weight Body mass index (BMI) is used to identify weight problems. It estimates body fat based on height and weight. Your health care provider can help determine your BMI and help you achieve or maintain a healthy weight. Get regular exercise Get regular exercise. This is one of the most important things you can do for your health. Most adults should: Exercise for at least 150 minutes each week. The exercise should increase your heart rate and make you sweat (moderate-intensity exercise). Do strengthening exercises at least twice a week. This is in addition to the moderate-intensity exercise. Spend less time sitting. Even light physical activity can be beneficial. Watch cholesterol and blood lipids Have your blood tested for lipids and cholesterol at 57 years of age, then have this test every 5 years. Have your cholesterol levels checked more often if: Your lipid or cholesterol levels are high. You are older than 57 years of age. You are at high risk for heart disease. What should I know about cancer screening? Depending on your health history and family history, you may need to have cancer screening at various ages. This may include screening for: Breast cancer. Cervical cancer. Colorectal cancer. Skin cancer. Lung cancer. What should I know about heart disease, diabetes, and high blood pressure? Blood pressure and heart  disease High blood pressure causes heart disease and increases the risk of stroke. This is more likely to develop in people who have high blood pressure readings or are overweight. Have your blood pressure checked: Every 3-5 years if you are 18-39 years of age. Every year if you are 40 years old or older. Diabetes Have regular diabetes screenings. This checks your fasting blood sugar level. Have the screening done: Once every three years after age 40 if you are at a normal weight and have a low risk for diabetes. More often and at a younger age if you are overweight or have a high risk for diabetes. What should I know about preventing infection? Hepatitis B If you have a higher risk for hepatitis B, you should be screened for this virus. Talk with your health care provider to find out if you are at risk for hepatitis B infection. Hepatitis C Testing is recommended for: Everyone born from 1945 through 1965. Anyone with known risk factors for hepatitis C. Sexually transmitted infections (STIs) Get screened for STIs, including gonorrhea and chlamydia, if: You are sexually active and are younger than 57 years of age. You are older than 57 years of age and your health care provider tells you that you are at risk for this type of infection. Your sexual activity has changed since you were last screened, and you are at increased risk for chlamydia or gonorrhea. Ask your health care provider if you are at risk. Ask your health care provider about whether you are at high risk for HIV. Your health care provider may recommend a prescription medicine to help prevent HIV   infection. If you choose to take medicine to prevent HIV, you should first get tested for HIV. You should then be tested every 3 months for as long as you are taking the medicine. Pregnancy If you are about to stop having your period (premenopausal) and you may become pregnant, seek counseling before you get pregnant. Take 400 to 800  micrograms (mcg) of folic acid every day if you become pregnant. Ask for birth control (contraception) if you want to prevent pregnancy. Osteoporosis and menopause Osteoporosis is a disease in which the bones lose minerals and strength with aging. This can result in bone fractures. If you are 38 years old or older, or if you are at risk for osteoporosis and fractures, ask your health care provider if you should: Be screened for bone loss. Take a calcium or vitamin D supplement to lower your risk of fractures. Be given hormone replacement therapy (HRT) to treat symptoms of menopause. Follow these instructions at home: Alcohol use Do not drink alcohol if: Your health care provider tells you not to drink. You are pregnant, may be pregnant, or are planning to become pregnant. If you drink alcohol: Limit how much you have to: 0-1 drink a day. Know how much alcohol is in your drink. In the [place] one drink equals one 12 oz bottle of beer (355 mL), one 5 oz glass of wine (148 mL), or one 1 oz glass of hard liquor (44 mL). Lifestyle Do not use any products that contain nicotine or tobacco. These products include cigarettes, chewing tobacco, and vaping devices, such as e-cigarettes. If you need help quitting, ask your health care provider. Do not use street drugs. Do not share needles. Ask your health care provider for help if you need support or information about quitting drugs. General instructions Schedule regular health, dental, and eye exams. Stay current with your vaccines. Tell your health care provider if: You often feel depressed. You have ever been abused or do not feel safe at home. Summary Adopting a healthy lifestyle and getting preventive care are important in promoting health and wellness. Follow your health care provider's instructions about healthy diet, exercising, and getting tested or screened for diseases. Follow your health care provider's instructions on monitoring your  cholesterol and blood pressure. This information is not intended to replace advice given to you by your health care provider. Make sure you discuss any questions you have with your health care provider. Document Revised: 02/27/2021 Document Reviewed: 02/27/2021 Elsevier Patient Education  [place] refers to food and lifestyle choices that are based on the traditions of countries located on the [employer]. It focuses on eating more fruits, vegetables, whole grains, beans, nuts, seeds, and heart-healthy fats, and eating less dairy, meat, eggs, and processed foods with added sugar, salt, and fat. This way of eating has been shown to help prevent certain conditions and improve outcomes for people who have chronic diseases, like kidney disease and heart disease. What are tips for following this plan? Reading food labels Check the serving size of packaged foods. For foods such as rice and pasta, the serving size refers to the amount of cooked product, not dry. Check the total fat in packaged foods. Avoid foods that have saturated fat or trans fats. Check the ingredient list for added sugars, such as corn syrup. Shopping  Buy a variety of foods that offer a balanced diet, including: Fresh fruits and vegetables (produce). Grains, beans, nuts, and seeds. Some of  these may be available in unpackaged forms or large amounts (in bulk). Fresh seafood. Poultry and eggs. Low-fat dairy products. Buy whole ingredients instead of prepackaged foods. Buy fresh fruits and vegetables in-season from local farmers markets. Buy plain frozen fruits and vegetables. If you do not have access to quality fresh seafood, buy precooked frozen shrimp or canned fish, such as tuna, salmon, or sardines. Stock your pantry so you always have certain foods on hand, such as olive oil, canned tuna, canned tomatoes, rice, pasta, and beans. Cooking Cook foods with extra-virgin  olive oil instead of using butter or other vegetable oils. Have meat as a side dish, and have vegetables or grains as your main dish. This means having meat in small portions or adding small amounts of meat to foods like pasta or stew. Use beans or vegetables instead of meat in common dishes like chili or lasagna. Experiment with different cooking methods. Try roasting, broiling, steaming, and sauting vegetables. Add frozen vegetables to soups, stews, pasta, or rice. Add nuts or seeds for added healthy fats and plant protein at each meal. You can add these to yogurt, salads, or vegetable dishes. Marinate fish or vegetables using olive oil, lemon juice, garlic, and fresh herbs. Meal planning Plan to eat one vegetarian meal one day each week. Try to work up to two vegetarian meals, if possible. Eat seafood two or more times a week. Have healthy snacks readily available, such as: Vegetable sticks with hummus. Greek yogurt. Fruit and nut trail mix. Eat balanced meals throughout the week. This includes: Fruit: 2-3 servings a day. Vegetables: 4-5 servings a day. Low-fat dairy: 2 servings a day. Fish, poultry, or lean meat: 1 serving a day. Beans and legumes: 2 or more servings a week. Nuts and seeds: 1-2 servings a day. Whole grains: 6-8 servings a day. Extra-virgin olive oil: 3-4 servings a day. Limit red meat and sweets to only a few servings a month. Lifestyle  Cook and eat meals together with your family, when possible. Drink enough fluid to keep your urine pale yellow. Be physically active every day. This includes: Aerobic exercise like running or swimming. Leisure activities like gardening, walking, or housework. Get 7-8 hours of sleep each night. If recommended by your health care provider, drink red wine in moderation. This means 1 glass a day for nonpregnant women and 2 glasses a day for men. A glass of wine equals 5 oz (150 mL). What foods should I eat? Fruits Apples.  Apricots. Avocado. Berries. Bananas. Cherries. Dates. Figs. Grapes. Lemons. Melon. Oranges. Peaches. Plums. Pomegranate. Vegetables Artichokes. Beets. Broccoli. Cabbage. Carrots. Eggplant. Green beans. Chard. Kale. Spinach. Onions. Leeks. Peas. Squash. Tomatoes. Peppers. Radishes. Grains Whole-grain pasta. Brown rice. Bulgur wheat. Polenta. Couscous. Whole-wheat bread. [place]. Meats and other proteins Beans. Almonds. Sunflower seeds. Pine nuts. Peanuts. [place]. Salmon. Scallops. Shrimp. [place]. Tilapia. Clams. Oysters. Eggs. Poultry without skin. Dairy Low-fat milk. Cheese. Greek yogurt. Fats and oils Extra-virgin olive oil. Avocado oil. Grapeseed oil. Beverages Water. Red wine. Herbal tea. Sweets and desserts Greek yogurt with honey. Baked apples. Poached pears. Trail mix. Seasonings and condiments Basil. Cilantro. Coriander. Cumin. Mint. Parsley. Sage. Rosemary. Tarragon. Garlic. Oregano. Thyme. Pepper. Balsamic vinegar. Tahini. Hummus. Tomato sauce. Olives. Mushrooms. The items listed above may not be a complete list of foods and beverages you can eat. Contact a dietitian for more information. What foods should I limit? This is a list of foods that should be eaten rarely or only on special occasions. Fruits Fruit  canned in syrup. Vegetables Deep-fried potatoes (french fries). Grains Prepackaged pasta or rice dishes. Prepackaged cereal with added sugar. Prepackaged snacks with added sugar. Meats and other proteins Beef. Pork. Lamb. Poultry with skin. Hot dogs. Berniece Salines. Dairy Ice cream. Sour cream. Whole milk. Fats and oils Butter. Canola oil. Vegetable oil. Beef fat (tallow). Lard. Beverages Juice. Sugar-sweetened soft drinks. Beer. Liquor and spirits. Sweets and desserts Cookies. Cakes. Pies. Candy. Seasonings and condiments Mayonnaise. Pre-made sauces and marinades. The items listed above may not be a complete list of foods and beverages you should limit. Contact a  dietitian for more information. Summary The Mediterranean diet includes both food and lifestyle choices. Eat a variety of fresh fruits and vegetables, beans, nuts, seeds, and whole grains. Limit the amount of red meat and sweets that you eat. If recommended by your health care provider, drink red wine in moderation. This means 1 glass a day for nonpregnant women and 2 glasses a day for men. A glass of wine equals 5 oz (150 mL). This information is not intended to replace advice given to you by your health care provider. Make sure you discuss any questions you have with your health care provider. Document Revised: 11/13/2019 Document Reviewed: 09/10/2019 Elsevier Patient Education  [place].

## 2022-04-19 NOTE — Progress Notes (Signed)
Gynecology Annual Exam  PCP: Marinda Elk, MD  Chief Complaint:  Chief Complaint  Patient presents with   Annual Exam    History of Present Illness:Patient is a 56 y.o. [phone] presents for annual exam. She mentions some burning with urination that started recently.   LMP: Patient's last menstrual period was 10/22/2013.  Postcoital Bleeding: no   The patient is sexually active. She denies dyspareunia.  The patient does perform self breast exams.  There is no notable family history of breast or ovarian cancer in her family.  The patient wears seatbelts: yes.   The patient has regular exercise: she does stationary bike, walking and some lifting, she is trying to make improvements in diet (heavy on carbs) and hydration, she admits adequate sleep.    The patient denies current symptoms of depression. She has some recent family stressors and reports good coping techniques and strong support system.   Review of Systems: Review of Systems  Constitutional:  Negative for chills and fever.  HENT:  Negative for congestion, ear discharge, ear pain, hearing loss, sinus pain and sore throat.   Eyes:  Negative for blurred vision and double vision.  Respiratory:  Negative for cough, shortness of breath and wheezing.   Cardiovascular:  Negative for chest pain, palpitations and leg swelling.  Gastrointestinal:  Negative for abdominal pain, blood in stool, constipation, diarrhea, heartburn, melena, nausea and vomiting.  Genitourinary:  Positive for dysuria. Negative for flank pain, frequency, hematuria and urgency.  Musculoskeletal:  Negative for back pain, joint pain and myalgias.  Skin:  Negative for itching and rash.  Neurological:  Negative for dizziness, tingling, tremors, sensory change, speech change, focal weakness, seizures, loss of consciousness, weakness and headaches.  Endo/Heme/Allergies:  Negative for environmental allergies. Does not bruise/bleed easily.   Psychiatric/Behavioral:  Negative for depression, hallucinations, memory loss, substance abuse and suicidal ideas. The patient is not nervous/anxious and does not have insomnia.     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   Lump or mass in breast     Past Surgical History:  Past Surgical History:  Procedure Laterality Date   BREAST BIOPSY Right 2013   needle, benign   CESAREAN SECTION  1992,2000   x 2   CHOLECYSTECTOMY N/A 12/11/2017   Procedure: LAPAROSCOPIC CHOLECYSTECTOMY;  Surgeon: Herbert Pun, MD;  Location: [facility] ORS;  Service: General;  Laterality: N/A;   COLONOSCOPY WITH PROPOFOL N/A 04/08/2020   Procedure: COLONOSCOPY WITH PROPOFOL;  Surgeon: Jonathon Bellows, MD;  Location: [facility] ENDOSCOPY;  Service: Gastroenterology;  Laterality: N/A;   DILATION AND CURETTAGE OF UTERUS     ESOPHAGOGASTRODUODENOSCOPY (EGD) WITH PROPOFOL N/A 11/04/2020   Procedure: ESOPHAGOGASTRODUODENOSCOPY (EGD) WITH PROPOFOL;  Surgeon: Jonathon Bellows, MD;  Location: [facility] ENDOSCOPY;  Service: Gastroenterology;  Laterality: N/A;   excision of squamous cell cancer of left forearm  2017    Gynecologic History:  Patient's last menstrual period was 10/22/2013. Last Pap: 11/12/2018 Results were:  no abnormalities  Last mammogram: 08/07/2021 Results were:  BiRads 0 with normal follow up  Obstetric History: [id number]  Family History:  Family History  Problem Relation Age of Onset   Breast cancer Paternal Aunt 67   Hyperlipidemia Mother    Hypertension Mother    Neuropathy Mother  in feet   Hypertension Father    Hypertension Sister    Hypertension Brother    Hypertension Brother    Pancreatic cancer Maternal Aunt 60    Social History:  Social History   Socioeconomic History   Marital status:  Married    Spouse name: Not on file   Number of children: 2   Years of education: Not on file   Highest education level: Not on file  Occupational History   Occupation: Estate agent  Tobacco Use   Smoking status: Never   Smokeless tobacco: Never  Vaping Use   Vaping Use: Never used  Substance and Sexual Activity   Alcohol use: No   Drug use: No   Sexual activity: Yes    Partners: Male    Birth control/protection: Post-menopausal  Other Topics Concern   Not on file  Social History Narrative   Moved to near [place] course in 2017/18.  [name] graduated from [employer] and now lives in [place]. Son Hold graduated from high school in 2018 and is attending [employer]   Social Determinants of Health   Financial Resource Strain: Not on file  Food Insecurity: Not on file  Transportation Needs: Not on file  Physical Activity: Not on file  Stress: Not on file  Social Connections: Not on file  Intimate Partner Violence: Not on file    Allergies:  No Known Allergies  Medications: Prior to Admission medications   Medication Sig Start Date End Date Taking? Authorizing Provider  cetirizine (ZYRTEC) 10 MG tablet Take by mouth.   Yes [provider]  ibuprofen (ADVIL,MOTRIN) 200 MG tablet Take 600 mg by mouth every 8 (eight) hours as needed for headache or mild pain.   Yes [provider]  lisinopril (PRINIVIL,ZESTRIL) 10 MG tablet Take 10 mg by mouth daily. In the morning   Yes [provider]  omeprazole (PRILOSEC) 40 MG capsule Take 1 capsule (40 mg total) by mouth daily. 08/24/20  Yes Jonathon Bellows, MD    Physical Exam Vitals: Blood pressure (!) 148/98, height '5\' 3"'$  (1.6 m), weight 183 lb (83 kg), last menstrual period 10/22/2013.  General: NAD HEENT: normocephalic, anicteric Thyroid: no enlargement, no palpable nodules Pulmonary: No increased work of breathing, CTAB Cardiovascular: RRR, distal pulses 2+ Breast: Breast symmetrical, no  tenderness, no palpable nodules or masses, no skin or nipple retraction present, no nipple discharge.  No axillary or supraclavicular lymphadenopathy. Abdomen: NABS, soft, non-tender, non-distended.  Umbilicus without lesions.  No hepatomegaly, splenomegaly or masses palpable. No evidence of hernia  Genitourinary:  External: Normal external female genitalia.  Normal urethral meatus, normal Bartholin's and Skene's glands.    Vagina: Normal vaginal mucosa, no evidence of prolapse.    Cervix: Grossly normal in appearance, no bleeding  Uterus: Non-enlarged, mobile, normal contour.  No CMT  Adnexa: ovaries non-enlarged, no adnexal masses  Rectal: deferred  Lymphatic: no evidence of inguinal lymphadenopathy Extremities: no edema, erythema, or tenderness Neurologic: Grossly intact Psychiatric: mood appropriate, affect full   Assessment: Jessica Harmon routine annual exam  Plan: Problem List Items Addressed This Visit   None Visit Diagnoses     Well woman exam with routine gynecological exam    -  Primary   Relevant Orders   Cytology - PAP   Screening for cervical cancer       Relevant Orders   Cytology - PAP   Breast screening       Relevant Orders   MM  3D SCREEN BREAST BILATERAL   Dysuria       Relevant Orders   Urine Culture       1) Mammogram - recommend yearly screening mammogram.  Mammogram Was ordered today  2) STI screening  was offered and declined  3) ASCCP guidelines and rationale discussed.  Patient opts for every 3 years screening interval. PAP today  Jessica) Osteoporosis  - per USPTF routine screening DEXA at age 41  Consider FDA-approved medical therapies in postmenopausal women and men aged 3 years and older, based on the following: a) A hip or vertebral (clinical or morphometric) fracture b) T-score ? -2.5 at the femoral neck or spine after appropriate evaluation to exclude secondary causes C) Low bone mass (T-score between -1.0 and -2.5 at the femoral neck or  spine) and a 10-year probability of a hip fracture ? 3% or a 10-year probability of a major osteoporosis-related fracture ? 20% based on the [place]-adapted WHO algorithm   5) Routine healthcare maintenance including cholesterol, diabetes screening discussed managed by PCP  6) Colonoscopy: normal in 2021.  Screening recommended starting at age 89 for average risk individuals, age 39 for individuals deemed at increased risk (including African Americans) and recommended to continue until age 59.  For patient age 76-85 individualized approach is recommended.  Gold standard screening is via colonoscopy, Cologuard screening is an acceptable alternative for patient unwilling or unable to undergo colonoscopy.  "Colorectal cancer screening for average?risk adults: 2018 guideline update from the American Cancer Society"CA: A Cancer Journal for Clinicians: Mar 20, 2017   7) Follow up as needed after labs result  8) Return in about 1 year (around 04/20/2023) for annual established gyn.    Christean Leaf, [facility] 04/19/22, 10:57 AM

## 2022-04-21 LAB — URINE CULTURE

## 2022-04-23 LAB — CYTOLOGY - PAP
Comment: NEGATIVE
Diagnosis: NEGATIVE
High risk HPV: NEGATIVE

## 2022-08-08 ENCOUNTER — Ambulatory Visit
Admission: RE | Admit: 2022-08-08 | Discharge: 2022-08-08 | Disposition: A | Discharge status: Home / Self Care | Payer: 59 | Source: Ambulatory Visit | Attending: Advanced Practice Midwife | Admitting: Advanced Practice Midwife

## 2022-08-08 DIAGNOSIS — Z1231 Encounter for screening mammogram for malignant neoplasm of breast: Secondary | ICD-10-CM | POA: Insufficient documentation

## 2022-08-08 DIAGNOSIS — Z1239 Encounter for other screening for malignant neoplasm of breast: Secondary | ICD-10-CM

## 2023-01-01 ENCOUNTER — Other Ambulatory Visit: Payer: Self-pay

## 2023-01-01 DIAGNOSIS — Z1231 Encounter for screening mammogram for malignant neoplasm of breast: Secondary | ICD-10-CM

## 2023-11-26 ENCOUNTER — Other Ambulatory Visit: Payer: Self-pay | Admitting: Physician Assistant

## 2023-11-26 DIAGNOSIS — Z1231 Encounter for screening mammogram for malignant neoplasm of breast: Secondary | ICD-10-CM

## 2024-01-03 ENCOUNTER — Ambulatory Visit
Admission: RE | Admit: 2024-01-03 | Discharge: 2024-01-03 | Disposition: A | Discharge status: Home / Self Care | Payer: Self-pay | Source: Ambulatory Visit | Attending: Physician Assistant | Admitting: Physician Assistant

## 2024-01-03 DIAGNOSIS — Z1231 Encounter for screening mammogram for malignant neoplasm of breast: Secondary | ICD-10-CM | POA: Insufficient documentation
